# Patient Record
Sex: Female | Born: 1960 | Race: Black or African American | Marital: Married | Smoking: Never smoker
Health system: Southern US, Community
[De-identification: ages and names within clinical notes are randomized; demographics above are authoritative.]

## PROBLEM LIST (undated history)

## (undated) DIAGNOSIS — B9689 Other specified bacterial agents as the cause of diseases classified elsewhere: Secondary | ICD-10-CM

## (undated) DIAGNOSIS — B379 Candidiasis, unspecified: Secondary | ICD-10-CM

## (undated) DIAGNOSIS — Z8639 Personal history of other endocrine, nutritional and metabolic disease: Secondary | ICD-10-CM

## (undated) DIAGNOSIS — Z8742 Personal history of other diseases of the female genital tract: Secondary | ICD-10-CM

## (undated) DIAGNOSIS — R87629 Unspecified abnormal cytological findings in specimens from vagina: Secondary | ICD-10-CM

## (undated) DIAGNOSIS — N930 Postcoital and contact bleeding: Secondary | ICD-10-CM

## (undated) DIAGNOSIS — Z8619 Personal history of other infectious and parasitic diseases: Secondary | ICD-10-CM

## (undated) DIAGNOSIS — N760 Acute vaginitis: Secondary | ICD-10-CM

## (undated) HISTORY — DX: Personal history of other diseases of the female genital tract: Z87.42

## (undated) HISTORY — DX: Personal history of other endocrine, nutritional and metabolic disease: Z86.39

## (undated) HISTORY — PX: TONSILLECTOMY: SUR1361

## (undated) HISTORY — DX: Unspecified abnormal cytological findings in specimens from vagina: R87.629

## (undated) HISTORY — PX: DILATION AND CURETTAGE OF UTERUS: SHX78

## (undated) HISTORY — DX: Postcoital and contact bleeding: N93.0

## (undated) HISTORY — DX: Candidiasis, unspecified: B37.9

## (undated) HISTORY — DX: Personal history of other infectious and parasitic diseases: Z86.19

## (undated) HISTORY — DX: Other specified bacterial agents as the cause of diseases classified elsewhere: B96.89

## (undated) HISTORY — DX: Acute vaginitis: N76.0

## (undated) HISTORY — PX: WISDOM TOOTH EXTRACTION: SHX21

---

## 2011-06-15 ENCOUNTER — Encounter (INDEPENDENT_AMBULATORY_CARE_PROVIDER_SITE_OTHER): Payer: PRIVATE HEALTH INSURANCE | Admitting: Obstetrics and Gynecology

## 2011-06-15 DIAGNOSIS — N930 Postcoital and contact bleeding: Secondary | ICD-10-CM

## 2011-06-15 DIAGNOSIS — Z01419 Encounter for gynecological examination (general) (routine) without abnormal findings: Secondary | ICD-10-CM

## 2011-06-15 DIAGNOSIS — Z202 Contact with and (suspected) exposure to infections with a predominantly sexual mode of transmission: Secondary | ICD-10-CM

## 2011-06-15 DIAGNOSIS — B379 Candidiasis, unspecified: Secondary | ICD-10-CM

## 2011-06-15 DIAGNOSIS — Z8639 Personal history of other endocrine, nutritional and metabolic disease: Secondary | ICD-10-CM

## 2011-06-15 DIAGNOSIS — N760 Acute vaginitis: Secondary | ICD-10-CM

## 2011-06-15 DIAGNOSIS — Z3040 Encounter for surveillance of contraceptives, unspecified: Secondary | ICD-10-CM

## 2011-06-15 DIAGNOSIS — Z8742 Personal history of other diseases of the female genital tract: Secondary | ICD-10-CM

## 2011-06-15 DIAGNOSIS — B9689 Other specified bacterial agents as the cause of diseases classified elsewhere: Secondary | ICD-10-CM

## 2011-06-15 HISTORY — DX: Candidiasis, unspecified: B37.9

## 2011-06-15 HISTORY — DX: Personal history of other diseases of the female genital tract: Z87.42

## 2011-06-15 HISTORY — DX: Other specified bacterial agents as the cause of diseases classified elsewhere: B96.89

## 2011-06-15 HISTORY — DX: Postcoital and contact bleeding: N93.0

## 2011-06-15 HISTORY — DX: Personal history of other endocrine, nutritional and metabolic disease: Z86.39

## 2011-06-15 HISTORY — DX: Acute vaginitis: N76.0

## 2011-06-28 ENCOUNTER — Other Ambulatory Visit (INDEPENDENT_AMBULATORY_CARE_PROVIDER_SITE_OTHER): Payer: PRIVATE HEALTH INSURANCE

## 2011-06-28 DIAGNOSIS — Z3040 Encounter for surveillance of contraceptives, unspecified: Secondary | ICD-10-CM

## 2011-09-19 ENCOUNTER — Other Ambulatory Visit: Payer: PRIVATE HEALTH INSURANCE

## 2014-11-05 ENCOUNTER — Observation Stay (HOSPITAL_COMMUNITY): Payer: 59

## 2014-11-05 ENCOUNTER — Observation Stay (HOSPITAL_COMMUNITY)
Admission: EM | Admit: 2014-11-05 | Discharge: 2014-11-07 | Disposition: A | Discharge status: Home / Self Care | Payer: 59 | Attending: Internal Medicine | Admitting: Internal Medicine

## 2014-11-05 ENCOUNTER — Encounter (HOSPITAL_COMMUNITY): Payer: Self-pay

## 2014-11-05 DIAGNOSIS — D62 Acute posthemorrhagic anemia: Secondary | ICD-10-CM | POA: Diagnosis not present

## 2014-11-05 DIAGNOSIS — R0600 Dyspnea, unspecified: Secondary | ICD-10-CM | POA: Diagnosis not present

## 2014-11-05 DIAGNOSIS — N939 Abnormal uterine and vaginal bleeding, unspecified: Secondary | ICD-10-CM | POA: Insufficient documentation

## 2014-11-05 DIAGNOSIS — D649 Anemia, unspecified: Secondary | ICD-10-CM

## 2014-11-05 DIAGNOSIS — Z79899 Other long term (current) drug therapy: Secondary | ICD-10-CM | POA: Diagnosis not present

## 2014-11-05 DIAGNOSIS — N921 Excessive and frequent menstruation with irregular cycle: Secondary | ICD-10-CM | POA: Diagnosis not present

## 2014-11-05 DIAGNOSIS — R600 Localized edema: Secondary | ICD-10-CM | POA: Diagnosis not present

## 2014-11-05 DIAGNOSIS — R609 Edema, unspecified: Secondary | ICD-10-CM

## 2014-11-05 DIAGNOSIS — R531 Weakness: Secondary | ICD-10-CM | POA: Diagnosis present

## 2014-11-05 LAB — COMPREHENSIVE METABOLIC PANEL
ALBUMIN: 3.8 g/dL (ref 3.5–5.0)
ALK PHOS: 58 U/L (ref 38–126)
ALT: 13 U/L — ABNORMAL LOW (ref 14–54)
ANION GAP: 4 — AB (ref 5–15)
AST: 19 U/L (ref 15–41)
BILIRUBIN TOTAL: 0.6 mg/dL (ref 0.3–1.2)
BUN: 9 mg/dL (ref 6–20)
CALCIUM: 8 mg/dL — AB (ref 8.9–10.3)
CO2: 23 mmol/L (ref 22–32)
CREATININE: 0.79 mg/dL (ref 0.44–1.00)
Chloride: 110 mmol/L (ref 101–111)
GFR calc Af Amer: >60 mL/min (ref >60)
GFR calc non Af Amer: >60 mL/min (ref >60)
Glucose, Bld: 110 mg/dL — ABNORMAL HIGH (ref 65–99)
Potassium: 3.5 mmol/L (ref 3.5–5.1)
Sodium: 137 mmol/L (ref 135–145)
TOTAL PROTEIN: 7.4 g/dL (ref 6.5–8.1)

## 2014-11-05 LAB — CBC WITH DIFFERENTIAL/PLATELET
BASOS PCT: 1 % (ref 0–1)
Basophils Absolute: 0 10*3/uL (ref 0.0–0.1)
Eosinophils Absolute: 0.2 10*3/uL (ref 0.0–0.7)
Eosinophils Relative: 2 % (ref 0–5)
HCT: 21.9 % — ABNORMAL LOW (ref 36.0–46.0)
HEMOGLOBIN: 6.4 g/dL — AB (ref 12.0–15.0)
LYMPHS ABS: 2.3 10*3/uL (ref 0.7–4.0)
Lymphocytes Relative: 32 % (ref 12–46)
MCH: 23.7 pg — ABNORMAL LOW (ref 26.0–34.0)
MCHC: 29.2 g/dL — AB (ref 30.0–36.0)
MCV: 81.1 fL (ref 78.0–100.0)
Monocytes Absolute: 0.4 10*3/uL (ref 0.1–1.0)
Monocytes Relative: 6 % (ref 3–12)
NEUTROS ABS: 4.3 10*3/uL (ref 1.7–7.7)
Neutrophils Relative %: 59 % (ref 43–77)
PLATELETS: 544 10*3/uL — AB (ref 150–400)
RBC: 2.7 MIL/uL — AB (ref 3.87–5.11)
RDW: 16.7 % — AB (ref 11.5–15.5)
WBC: 7.3 10*3/uL (ref 4.0–10.5)

## 2014-11-05 LAB — ABO/RH: ABO/RH(D): O POS

## 2014-11-05 LAB — D-DIMER, QUANTITATIVE (NOT AT ARMC): D DIMER QUANT: 0.42 ug{FEU}/mL (ref 0.00–0.48)

## 2014-11-05 LAB — PREPARE RBC (CROSSMATCH)

## 2014-11-05 MED ORDER — MEGESTROL ACETATE 40 MG PO TABS
40.0000 mg | ORAL_TABLET | Freq: Every day | ORAL | Status: DC
  Administered 2014-11-05 – 2014-11-06 (×2): 40 mg via ORAL
  Filled 2014-11-05 (×2): qty 1

## 2014-11-05 MED ORDER — SODIUM CHLORIDE 0.9 % IV SOLN
10.0000 mL/h | Freq: Once | INTRAVENOUS | Status: AC
  Administered 2014-11-05: 10 mL/h via INTRAVENOUS

## 2014-11-05 NOTE — H&P (Addendum)
History and Physical  Shannon Collier ZOX:[id number] DOB: 1961-03-14 DOA: 11/05/2014  Referring physician: EDP PCP: Hal Morales, MD   Chief Complaint: weakness, anemia  HPI: Shannon Collier is a 54 y.o. female  With no significant past medical history is sent from PMD to ER due to weakness, anemia, DOE. Patient reported she has noticed spotting/irregular periods for the last few months. She reported her regular cycle stopped early this year in January, then she joined a weight loss program and has been getting HCG shots, she notice she has been having vaginal bleeding daily requiring two pad a day, she denies ab pain, no fever. She was seen by pmd yesterday due to progressive weakness, sob, dizziness, lab work showed she is significantly anemia, pmd called her today and advised her to come to the ED.  In the ED hgb 6.4, hospitalist called for admission.  Patient also reported recent trip to [facility], she noticed left leg edema when she drove back home. She reported some mild dry cough, no chest pain, does has DOE, feeling dizzy and weak.     Review of Systems:  Detail per HPI, Review of systems are otherwise negative  Past Medical History  Diagnosis Date  . H/O varicella   . Yeast infection 06/15/11  . H/O menorrhagia 06/15/11  . Post - coital bleeding 06/15/11  . BV (bacterial vaginosis) 06/15/11  . H/O goiter 06/15/11   Past Surgical History  Procedure Laterality Date  . Tonsillectomy    . Wisdom tooth extraction    . Dilation and curettage of uterus     Social History:  reports that she has never smoked. She has never used smokeless tobacco. She reports that she drinks alcohol. She reports that she does not use illicit drugs. Patient lives at home & is able to participate in activities of daily living independently   No Known Allergies  Family History  Problem Relation Age of Onset  . Diabetes Mother   . Arthritis Mother   . Thyroid disease Mother   . Diabetes Father        Prior to Admission medications   Medication Sig Start Date End Date Taking? Authorizing Provider  Aspirin-Acetaminophen-Caffeine (EXCEDRIN EXTRA STRENGTH PO) Take 2 tablets by mouth every 8 (eight) hours as needed (back pain).   Yes Historical Provider, MD  phentermine 37.5 MG capsule Take 37.5 mg by mouth every morning.   Yes Historical Provider, MD    Physical Exam: BP 138/69 mmHg  Pulse 90  Temp(Src) 98.2 F (36.8 C) (Oral)  Resp 20  SpO2 100%  LMP 11/05/2014  General:  Weak, look uncomfortable Eyes: PERRL ENT: unremarkable Neck: supple, no JVD Cardiovascular: RRR Respiratory: CTABL Abdomen: soft/ND/ND, positive bowel sounds Skin: no rash Musculoskeletal:  No edema Psychiatric: calm/cooperative Neurologic: no focal findings            Labs on Admission:  Basic Metabolic Panel:  Recent Labs Lab 11/05/14 1131  NA 137  K 3.5  CL 110  CO2 23  GLUCOSE 110*  BUN 9  CREATININE 0.79  CALCIUM 8.0*   Liver Function Tests:  Recent Labs Lab 11/05/14 1131  AST 19  ALT 13*  ALKPHOS 58  BILITOT 0.6  PROT 7.4  ALBUMIN 3.8   No results for input(s): LIPASE, AMYLASE in the last 168 hours. No results for input(s): AMMONIA in the last 168 hours. CBC:  Recent Labs Lab 11/05/14 1131  WBC 7.3  NEUTROABS 4.3  HGB 6.4*  HCT 21.9*  MCV 81.1  PLT 544*   Cardiac Enzymes: No results for input(s): CKTOTAL, CKMB, CKMBINDEX, TROPONINI in the last 168 hours.  BNP (last 3 results) No results for input(s): BNP in the last 8760 hours.  ProBNP (last 3 results) No results for input(s): PROBNP in the last 8760 hours.  CBG: No results for input(s): GLUCAP in the last 168 hours.  Radiological Exams on Admission: No results found.  EKG: Independently reviewed. pending  Assessment/Plan Present on Admission:  . Acute blood loss anemia   Acute blood loss anemia: patient is very symptomatic from it. likely from vaginal bleed, pelvic US pending, prbc transfusion.  Ivf. Gyn called by EDP. Gyn recommended start patient on megstrol and outpatient follow up with Dr. Erin Fulling and Monday endometrial biopsy.  Left lower extremity edema/DOE: recent long trip by car, lower extremity US and ddimer pending, will get CTA chest to r/o PE if ddimer elevated. May need IVC filter if she is tested positive for clot in the setting of blood loss anemia. Will also get echocardiogram due to dyspnea/ suspicious for PE, and h/o phentermine use.  Addendum: ddimer negative , will not order CTA at this time, awaiting lower extremity venous doppler study.  DVT prophylaxis: scd  Consultants: gyn called by EDP  Code Status: full   Family Communication:  Patient   Disposition Plan: admit to med tele  Time spent:  Andoni Busch MD, PhD [facility] Pager [phone] If 7PM-7AM, please contact night-coverage at [email or web address], password [facility]

## 2014-11-05 NOTE — ED Notes (Signed)
Doctor added on d-dimer.  Pt in ultrasound.  RN to notify us when she is back in her room.

## 2014-11-05 NOTE — ED Provider Notes (Signed)
CSN: [id number]     Arrival date & time 11/05/14  0920 History   First MD Initiated Contact with Patient 11/05/14 0935     Chief Complaint  Patient presents with  . Abnormal Lab     (Consider location/radiation/quality/duration/timing/severity/associated sxs/prior Treatment) Patient is a 54 y.o. female presenting with weakness. The history is provided by the patient. No language interpreter was used.  Weakness This is a new problem. The current episode started 1 to 4 weeks ago. The problem occurs constantly. The problem has been gradually worsening. Associated symptoms include weakness. Pertinent negatives include no abdominal pain. Nothing aggravates the symptoms. She has tried nothing for the symptoms.  Pt reports she did a mission camp a few weeks ago.  Pt reports progressive weakness.  She reports she has missed some periods but is now having daily spotting. Pt reports spotting started after hcg injection in March.  Pt thinks she is starting menopause.  Pt saw her MD yesterday, He called today and advised her that her hemoglobin was low and to come to the hospital for evaluation,  Pt reports fatigue and some trouble concentrating  Past Medical History  Diagnosis Date  . H/O varicella   . Yeast infection 06/15/11  . H/O menorrhagia 06/15/11  . Post - coital bleeding 06/15/11  . BV (bacterial vaginosis) 06/15/11  . H/O goiter 06/15/11   Past Surgical History  Procedure Laterality Date  . Tonsillectomy    . Wisdom tooth extraction    . Dilation and curettage of uterus     Family History  Problem Relation Age of Onset  . Diabetes Mother   . Arthritis Mother   . Thyroid disease Mother   . Diabetes Father    History  Substance Use Topics  . Smoking status: Never Smoker   . Smokeless tobacco: Never Used  . Alcohol Use: Yes     Comment: seldom   OB History    Gravida Para Term Preterm AB TAB SAB Ectopic Multiple Living   Review of Systems  Gastrointestinal:  Negative for abdominal pain.  Neurological: Positive for weakness.  All other systems reviewed and are negative.     Allergies  Review of patient's allergies indicates no known allergies.  Home Medications   Prior to Admission medications   Medication Sig Start Date End Date Taking? Authorizing Provider  Aspirin-Acetaminophen-Caffeine (EXCEDRIN EXTRA STRENGTH PO) Take 2 tablets by mouth every 8 (eight) hours as needed (back pain).   Yes Historical Provider, MD  phentermine 37.5 MG capsule Take 37.5 mg by mouth every morning.   Yes Historical Provider, MD   BP 138/69 mmHg  Pulse 90  Temp(Src) 98.2 F (36.8 C) (Oral)  Resp 20  SpO2 100%  LMP 11/05/2014 Physical Exam  Constitutional: She is oriented to person, place, and time. She appears well-developed and well-nourished.  HENT:  Head: Normocephalic.  Eyes: EOM are normal. Pupils are equal, round, and reactive to light.  Pale lids  Neck: Normal range of motion.  Cardiovascular: Normal rate, regular rhythm and normal heart sounds.   Pulmonary/Chest: Effort normal.  Abdominal: Soft. She exhibits no distension.  Musculoskeletal: Normal range of motion.  Neurological: She is alert and oriented to person, place, and time.  Skin: Skin is warm.  Psychiatric: She has a normal mood and affect.  Nursing note and vitals reviewed.   ED Course  Procedures (including critical care time) Labs Review Labs  Reviewed  CBC WITH DIFFERENTIAL/PLATELET - Abnormal; Notable for the following:    RBC 2.70 (*)    Hemoglobin 6.4 (*)    HCT 21.9 (*)    MCH 23.7 (*)    MCHC 29.2 (*)    RDW 16.7 (*)    Platelets 544 (*)    All other components within normal limits  COMPREHENSIVE METABOLIC PANEL - Abnormal; Notable for the following:    Glucose, Bld 110 (*)    Calcium 8.0 (*)    ALT 13 (*)    Anion gap 4 (*)    All other components within normal limits  TYPE AND SCREEN  PREPARE RBC (CROSSMATCH)  ABO/RH    Imaging Review No results  found.   EKG Interpretation None      MDM  Hemoglobin 6.4.   Pt type and cross ordered.   Call to hospitalist to see for admission   Final diagnoses:  Anemia, unspecified anemia type  Edema    I spoke to Dr. Erin Fulling who advised pt needs endometrial biopsy.  She will schedule for Monday Hospitalist advised    Elson Areas, PA-C 11/05/14 1353  Melene Plan, DO 11/05/14 1510

## 2014-11-05 NOTE — ED Notes (Addendum)
Patient states she was notified by her physician today that she had abnormal lab work. Patient states she thought they told her that she had a critical low iron level and a hgb level. Patient c/o feeling weak dizzy, and joint pain. Patient also states she has been having more frequent menstrual periods with heavy vaginal bleeding.

## 2014-11-05 NOTE — ED Notes (Signed)
Patient transported to Ultrasound 

## 2014-11-05 NOTE — ED Provider Notes (Signed)
Medical screening examination/treatment/procedure(s) were conducted as a shared visit with non-physician practitioner(s) and myself.  I personally evaluated the patient during the encounter.   EKG Interpretation None      54 yo F with a chief complaint of vaginal bleeding. This been going on for some time. Patient was seen at PCPs office found to have a low hemoglobin. Repeated here was 6.4. We'll transfuse the patient. Admit her to the hospital.  On my exam patient with pallor mild tachycardia, no noted murmurs abdomen soft nontender nondistended.  Melene Plan, DO 11/05/14 1316

## 2014-11-06 ENCOUNTER — Observation Stay (HOSPITAL_COMMUNITY): Payer: 59

## 2014-11-06 ENCOUNTER — Observation Stay (HOSPITAL_BASED_OUTPATIENT_CLINIC_OR_DEPARTMENT_OTHER): Payer: 59

## 2014-11-06 DIAGNOSIS — R609 Edema, unspecified: Secondary | ICD-10-CM

## 2014-11-06 DIAGNOSIS — D62 Acute posthemorrhagic anemia: Secondary | ICD-10-CM | POA: Diagnosis not present

## 2014-11-06 DIAGNOSIS — R0600 Dyspnea, unspecified: Secondary | ICD-10-CM | POA: Diagnosis not present

## 2014-11-06 DIAGNOSIS — N921 Excessive and frequent menstruation with irregular cycle: Secondary | ICD-10-CM | POA: Diagnosis not present

## 2014-11-06 LAB — TSH: TSH: 0.651 u[IU]/mL (ref 0.350–4.500)

## 2014-11-06 LAB — COMPREHENSIVE METABOLIC PANEL
ALT: 11 U/L — ABNORMAL LOW (ref 14–54)
AST: 18 U/L (ref 15–41)
Albumin: 3.2 g/dL — ABNORMAL LOW (ref 3.5–5.0)
Alkaline Phosphatase: 51 U/L (ref 38–126)
Anion gap: 4 — ABNORMAL LOW (ref 5–15)
BUN: 8 mg/dL (ref 6–20)
CO2: 22 mmol/L (ref 22–32)
CREATININE: 0.78 mg/dL (ref 0.44–1.00)
Calcium: 7.9 mg/dL — ABNORMAL LOW (ref 8.9–10.3)
Chloride: 109 mmol/L (ref 101–111)
GFR calc Af Amer: >60 mL/min (ref >60)
GLUCOSE: 109 mg/dL — AB (ref 65–99)
Potassium: 3.9 mmol/L (ref 3.5–5.1)
SODIUM: 135 mmol/L (ref 135–145)
Total Bilirubin: 0.7 mg/dL (ref 0.3–1.2)
Total Protein: 6.5 g/dL (ref 6.5–8.1)

## 2014-11-06 LAB — PREPARE RBC (CROSSMATCH)

## 2014-11-06 LAB — CBC
HEMATOCRIT: 26.7 % — AB (ref 36.0–46.0)
Hemoglobin: 8.1 g/dL — ABNORMAL LOW (ref 12.0–15.0)
MCH: 24.2 pg — AB (ref 26.0–34.0)
MCHC: 30.3 g/dL (ref 30.0–36.0)
MCV: 79.7 fL (ref 78.0–100.0)
Platelets: 452 10*3/uL — ABNORMAL HIGH (ref 150–400)
RBC: 3.35 MIL/uL — AB (ref 3.87–5.11)
RDW: 16.9 % — ABNORMAL HIGH (ref 11.5–15.5)
WBC: 7.7 10*3/uL (ref 4.0–10.5)

## 2014-11-06 LAB — PROTIME-INR
INR: 1.01 (ref 0.00–1.49)
PROTHROMBIN TIME: 13.5 s (ref 11.6–15.2)

## 2014-11-06 MED ORDER — SODIUM CHLORIDE 0.9 % IV SOLN: Freq: Once | INTRAVENOUS | Status: DC

## 2014-11-06 MED ORDER — MEGESTROL ACETATE 40 MG PO TABS
40.0000 mg | ORAL_TABLET | Freq: Two times a day (BID) | ORAL | Status: DC
  Administered 2014-11-06 – 2014-11-07 (×2): 40 mg via ORAL
  Filled 2014-11-06 (×3): qty 1

## 2014-11-06 MED ORDER — FERROUS SULFATE 325 (65 FE) MG PO TABS
325.0000 mg | ORAL_TABLET | Freq: Two times a day (BID) | ORAL | Status: DC
  Administered 2014-11-06 – 2014-11-07 (×2): 325 mg via ORAL
  Filled 2014-11-06 (×2): qty 1

## 2014-11-06 MED ORDER — MEGESTROL ACETATE 40 MG PO TABS: 40.0000 mg | ORAL_TABLET | Freq: Two times a day (BID) | ORAL | Status: DC

## 2014-11-06 MED ORDER — SENNOSIDES-DOCUSATE SODIUM 8.6-50 MG PO TABS: 1.0000 | ORAL_TABLET | Freq: Every day | ORAL | Status: AC

## 2014-11-06 NOTE — Progress Notes (Signed)
*  Preliminary Results* Bilateral lower extremity venous duplex completed. Bilateral lower extremities are negative for deep vein thrombosis. There is no evidence of Baker's cyst bilaterally.  11/06/2014  Gertie Fey, RVT, RDCS, RDMS

## 2014-11-06 NOTE — Progress Notes (Signed)
PROGRESS NOTE  Shannon Collier:[id number] DOB: Sep 11, 1960 DOA: 2014/11/20 PCP: Hal Morales, MD  HPI/Recap of past 24 hours:  Feeling litter better, still very weak  Assessment/Plan: Active Problems:   Acute blood loss anemia  Acute blood loss anemia: patient is very symptomatic from it.  prbc x2 on November 20, 2022, still weak in am, additional 1unit of prbc on 8/5. Repeat cbc in am. Will likely need to be  Discharged on iron supplement.  Vaginal bleed, pelvic US resulted noted, outpatient endometrial biopsy on  Monday. Dr. Erin Fulling contacted by EDP, recommended megace  po bid.  Left lower extremity edema/DOE: recent long trip by car, lower extremity US  Pending,  ddimer negative.  Dyspnea: likely from anemia, due to history of pherermine use, will check echocardiogram. No murmur heard on exam.    DVT prophylaxis: scd  Consultants: Dr. Erin Fulling, gyn called by EDP  Code Status: full   Family Communication: Patient    Disposition Plan: likely d/c tomorrow if medically stable   Consultants:  GYN Dr. Erin Fulling   Procedures:  prbc transfusionx3units  Antibiotics:  none   Objective: BP 119/54 mmHg  Pulse 64  Temp(Src) 98.2 F (36.8 C) (Oral)  Resp 18  Ht  (1.753 m)  Wt 90.538 kg (199 lb 9.6 oz)  BMI 29.46 kg/m2  SpO2 100%  LMP November 20, 2014  Intake/Output Summary (Last 24 hours) at 11/06/14 1207 Last data filed at 11/06/14 0800  Gross per 24 hour  Intake   1125 ml  Output      0 ml  Net   1125 ml   Filed Weights   2014/11/20 1456  Weight: 90.538 kg (199 lb 9.6 oz)    Exam:   General:  NAD  Cardiovascular: sinus tachycardia has improved.  Respiratory: CTABL  Abdomen: Soft/ND/NT, positive BS  Musculoskeletal: No Edema  Neuro: generalized weakness. No focal deficit.  Data Reviewed: Basic Metabolic Panel:  Recent Labs Lab 11/20/14 1131 11/06/14 0405  NA 137 135  K 3.5 3.9  CL 110 109  CO2 23 22  GLUCOSE 110*  109*  BUN 9 8  CREATININE 0.79 0.78  CALCIUM 8.0* 7.9*   Liver Function Tests:  Recent Labs Lab Nov 20, 2014 1131 11/06/14 0405  AST 19 18  ALT 13* 11*  ALKPHOS 58 51  BILITOT 0.6 0.7  PROT 7.4 6.5  ALBUMIN 3.8 3.2*   No results for input(s): LIPASE, AMYLASE in the last 168 hours. No results for input(s): AMMONIA in the last 168 hours. CBC:  Recent Labs Lab 11-20-14 1131 11/06/14 0405  WBC 7.3 7.7  NEUTROABS 4.3  --   HGB 6.4* 8.1*  HCT 21.9* 26.7*  MCV 81.1 79.7  PLT 544* 452*   Cardiac Enzymes:   No results for input(s): CKTOTAL, CKMB, CKMBINDEX, TROPONINI in the last 168 hours. BNP (last 3 results) No results for input(s): BNP in the last 8760 hours.  ProBNP (last 3 results) No results for input(s): PROBNP in the last 8760 hours.  CBG: No results for input(s): GLUCAP in the last 168 hours.  No results found for this or any previous visit (from the past 240 hour(s)).   Studies: US Transvaginal Non-ob  11-20-2014   CLINICAL DATA:  Continuous bleeding since March 2016. HCG use for weight loss.  EXAM: TRANSABDOMINAL AND TRANSVAGINAL ULTRASOUND OF PELVIS  TECHNIQUE: Both transabdominal and transvaginal ultrasound examinations of the pelvis were performed. Transabdominal technique was performed for global imaging of the pelvis including uterus, ovaries, adnexal regions,  and pelvic cul-de-sac. It was necessary to proceed with endovaginal exam following the transabdominal exam to visualize the ovaries and endometrium.  COMPARISON:  None  FINDINGS: Uterus  Measurements: 10 x 5 x 7 cm. At least 1 7 endometrial cyst noted.  Endometrium  Thickness: 14 mm. At the fundus, there is minimal cystic change or fluid accumulation. No discrete mass lesion is seen.  Right ovary  Measurements: 30 x 17 x 13 mm. A 15 mm hypoechoic mass within the right ovary has no internal color flow, but does have mid level echoes/septations.  Left ovary  Measurements: 32 x 10 x 13 mm. A rounded simple  appearing cyst in the left adnexa measures 22 mm and appears extra ovarian. There is a flattened cyst which is intra-ovarian, 25 x 7 x 17 mm  Other findings  No concerning pelvic fluid  IMPRESSION: 1. 13 mm endometrial stripe with mild heterogeneity. In the setting of abnormal bleeding and perimenopausal age, recommend gynecologic referral for consideration of endometrial sampling. 2. 15 mm complex cyst in the right ovary, appearance suggests a hemorrhagic cyst. Given age, followup in 6-12 weeks is recommended. 3. Simple appearing ovarian and paraovarian left-sided cysts measuring 25 and 22 mm respectively. 4. Subendometrial cystic change compatible with adenomyosis.   Electronically Signed   By: Marnee Spring M.D.   On: 11/05/2014 14:45   US Pelvis Complete  11/05/2014   CLINICAL DATA:  Continuous bleeding since March 2016. HCG use for weight loss.  EXAM: TRANSABDOMINAL AND TRANSVAGINAL ULTRASOUND OF PELVIS  TECHNIQUE: Both transabdominal and transvaginal ultrasound examinations of the pelvis were performed. Transabdominal technique was performed for global imaging of the pelvis including uterus, ovaries, adnexal regions, and pelvic cul-de-sac. It was necessary to proceed with endovaginal exam following the transabdominal exam to visualize the ovaries and endometrium.  COMPARISON:  None  FINDINGS: Uterus  Measurements: 10 x 5 x 7 cm. At least 1 7 endometrial cyst noted.  Endometrium  Thickness: 14 mm. At the fundus, there is minimal cystic change or fluid accumulation. No discrete mass lesion is seen.  Right ovary  Measurements: 30 x 17 x 13 mm. A 15 mm hypoechoic mass within the right ovary has no internal color flow, but does have mid level echoes/septations.  Left ovary  Measurements: 32 x 10 x 13 mm. A rounded simple appearing cyst in the left adnexa measures 22 mm and appears extra ovarian. There is a flattened cyst which is intra-ovarian, 25 x 7 x 17 mm  Other findings  No concerning pelvic fluid   IMPRESSION: 1. 13 mm endometrial stripe with mild heterogeneity. In the setting of abnormal bleeding and perimenopausal age, recommend gynecologic referral for consideration of endometrial sampling. 2. 15 mm complex cyst in the right ovary, appearance suggests a hemorrhagic cyst. Given age, followup in 6-12 weeks is recommended. 3. Simple appearing ovarian and paraovarian left-sided cysts measuring 25 and 22 mm respectively. 4. Subendometrial cystic change compatible with adenomyosis.   Electronically Signed   By: Marnee Spring M.D.   On: 11/05/2014 14:45    Scheduled Meds: . sodium chloride   Intravenous Once  . megestrol  40 mg Oral BID    Continuous Infusions:    Time spent:  Izyk Marty MD, PhD  [facility] Pager [phone]. If 7PM-7AM, please contact night-coverage at [email or web address], password [facility] 11/06/2014, 12:07 PM

## 2014-11-06 NOTE — Progress Notes (Signed)
*  PRELIMINARY RESULTS* Echocardiogram 2D Echocardiogram has been performed.  Shannon Collier 11/06/2014, 12:44 PM

## 2014-11-07 DIAGNOSIS — D62 Acute posthemorrhagic anemia: Secondary | ICD-10-CM | POA: Diagnosis not present

## 2014-11-07 DIAGNOSIS — N921 Excessive and frequent menstruation with irregular cycle: Secondary | ICD-10-CM | POA: Diagnosis not present

## 2014-11-07 LAB — BASIC METABOLIC PANEL
ANION GAP: 3 — AB (ref 5–15)
BUN: 9 mg/dL (ref 6–20)
CHLORIDE: 111 mmol/L (ref 101–111)
CO2: 22 mmol/L (ref 22–32)
Calcium: 8.1 mg/dL — ABNORMAL LOW (ref 8.9–10.3)
Creatinine, Ser: 0.69 mg/dL (ref 0.44–1.00)
GFR calc Af Amer: >60 mL/min (ref >60)
GFR calc non Af Amer: >60 mL/min (ref >60)
GLUCOSE: 105 mg/dL — AB (ref 65–99)
Potassium: 3.9 mmol/L (ref 3.5–5.1)
SODIUM: 136 mmol/L (ref 135–145)

## 2014-11-07 LAB — CBC
HCT: 30 % — ABNORMAL LOW (ref 36.0–46.0)
HEMOGLOBIN: 8.9 g/dL — AB (ref 12.0–15.0)
MCH: 23.3 pg — ABNORMAL LOW (ref 26.0–34.0)
MCHC: 29.7 g/dL — ABNORMAL LOW (ref 30.0–36.0)
MCV: 78.5 fL (ref 78.0–100.0)
Platelets: 419 10*3/uL — ABNORMAL HIGH (ref 150–400)
RBC: 3.82 MIL/uL — ABNORMAL LOW (ref 3.87–5.11)
RDW: 16.6 % — AB (ref 11.5–15.5)
WBC: 7.5 10*3/uL (ref 4.0–10.5)

## 2014-11-07 LAB — MAGNESIUM: Magnesium: 1.9 mg/dL (ref 1.7–2.4)

## 2014-11-07 MED ORDER — POLYETHYLENE GLYCOL 3350 17 G PO PACK: 17.0000 g | PACK | Freq: Every day | ORAL | Status: AC

## 2014-11-07 MED ORDER — FERROUS SULFATE 325 (65 FE) MG PO TABS: 325.0000 mg | ORAL_TABLET | Freq: Two times a day (BID) | ORAL | Status: AC

## 2014-11-07 NOTE — Progress Notes (Signed)
Pt discharge home by dr. Roda Shutters with follow up with gyn for endometrial biopsy next week. Pt is in agreement with plans. Pt alert and oriented, has no concerns at this time. Pt discharge home with family.

## 2014-11-07 NOTE — Discharge Summary (Signed)
Discharge Summary  Shannon Collier:[id number] DOB: 05/04/1960  PCP: Hal Morales, MD  Admit date: 11/05/2014 Discharge date: 11/07/2014  Time spent: <2mins  Recommendations for Outpatient Follow-up:  1. F/u with PMD in two week,s repeat cbc at follow up 2. F/u with gyn Dr. Erin Fulling for endometrial biopsy next week.  Discharge Diagnoses:  Active Hospital Problems   Diagnosis Date Noted  . Acute blood loss anemia 11/05/2014    Resolved Hospital Problems   Diagnosis Date Noted Date Resolved  No resolved problems to display.    Discharge Condition: stable  Diet recommendation: regular diet  Filed Weights   11/05/14 1456  Weight: 90.538 kg (199 lb 9.6 oz)    History of present illness:  Shannon Collier is a 54 y.o. female  With no significant past medical history is sent from PMD to ER due to weakness, anemia, DOE. Patient reported she has noticed spotting/irregular periods for the last few months. She reported her regular cycle stopped early this year in January, then she joined a weight loss program and has been getting HCG shots, she notice she has been having vaginal bleeding daily requiring two pad a day, she denies ab pain, no fever. She was seen by pmd yesterday due to progressive weakness, sob, dizziness, lab work showed she is significantly anemia, pmd called her today and advised her to come to the ED.  In the ED hgb 6.4, hospitalist called for admission.  Patient also reported recent trip to [facility], she noticed left leg edema when she drove back home. She reported some mild dry cough, no chest pain, does has DOE, feeling dizzy and weak.    Hospital Course:  Active Problems:   Acute blood loss anemia  Acute blood loss anemia: patient is very symptomatic from it. prbc x2 on 8/4, still weak in am, additional 1unit of prbc on 8/5.  Repeat cbc in am with appropriate increase of hgb. Discharge on iron supplement with stool softener. pmd to repeat cbc at  follow up to ensure stability.  Vaginal bleed,  pelvic US resulted noted, outpatient endometrial biopsy next week by Dr. Erin Fulling contacted by EDP,  Started megace  po bid.  Left lower extremity edema/DOE: recent long trip by car, lower extremity US negative for  DVT, ddimer negative. No edema at discharge.  Dyspnea: likely from anemia, due to history of pherermine use, echocardiogram, no valvular abnormality, LVEF wnl, mild diastolic dysfunction, euvolemic at discharge    Consultants: Dr. Erin Fulling, gyn called by EDP  Code Status: full   Family Communication: Patient    Disposition Plan:  d/c home   Consultants:  GYN Dr. Erin Fulling  Procedures:  prbc transfusionx3units  Antibiotics:  none  Discharge Exam: BP 115/66 mmHg  Pulse 73  Temp(Src) 98.3 F (36.8 C) (Oral)  Resp 16  Ht  (1.753 m)  Wt 90.538 kg (199 lb 9.6 oz)  BMI 29.46 kg/m2  SpO2 99%  LMP 11/05/2014   General: NAD  Cardiovascular: sinus tachycardia resolved  Respiratory: CTABL  Abdomen: Soft/ND/NT, positive BS  Musculoskeletal: No Edema  Neuro:  No focal deficit.   Discharge Instructions You were cared for by a hospitalist during your hospital stay. If you have any questions about your discharge medications or the care you received while you were in the hospital after you are discharged, you can call the unit and asked to speak with the hospitalist on call if the hospitalist that took care of you is not available. Once you  are discharged, your primary care physician will handle any further medical issues. Please note that NO REFILLS for any discharge medications will be authorized once you are discharged, as it is imperative that you return to your primary care physician (or establish a relationship with a primary care physician if you do not have one) for your aftercare needs so that they can reassess your need for medications and monitor your lab values.        Discharge Instructions    Diet - low sodium heart healthy    Complete by:  As directed      Increase activity slowly    Complete by:  As directed             Medication List    STOP taking these medications        EXCEDRIN EXTRA STRENGTH PO     phentermine 37.5 MG capsule      TAKE these medications        ferrous sulfate 325 (65 FE) MG tablet  Take 1 tablet (325 mg total) by mouth 2 (two) times daily with a meal.     megestrol 40 MG tablet  Commonly known as:  MEGACE  Take 1 tablet (40 mg total) by mouth 2 (two) times daily.     polyethylene glycol packet  Commonly known as:  MIRALAX  Take 17 g by mouth daily.     senna-docusate 8.6-50 MG per tablet  Commonly known as:  Senokot-S  Take 1 tablet by mouth at bedtime.       No Known Allergies Follow-up Information    Follow up with Willodean Rosenthal, MD On 11/09/2014.   Specialty:  Obstetrics and Gynecology   Why:  12noon , endometrial biopsy /vaginal bleed   Contact information:   [address] [place] [address] [phone]       Follow up with Hal Morales, MD In 2 weeks.   Specialty:  Obstetrics and Gynecology   Why:  hospital discharge follow up, repeat cbc at follow up   Contact information:   [address] [place] [address] [phone]        The results of significant diagnostics from this hospitalization (including imaging, microbiology, ancillary and laboratory) are listed below for reference.    Significant Diagnostic Studies: US Transvaginal Non-ob  November 27, 2014   CLINICAL DATA:  Continuous bleeding since March 2016. HCG use for weight loss.  EXAM: TRANSABDOMINAL AND TRANSVAGINAL ULTRASOUND OF PELVIS  TECHNIQUE: Both transabdominal and transvaginal ultrasound examinations of the pelvis were performed. Transabdominal technique was performed for global imaging of the pelvis including uterus, ovaries, adnexal regions, and pelvic cul-de-sac. It was necessary  to proceed with endovaginal exam following the transabdominal exam to visualize the ovaries and endometrium.  COMPARISON:  None  FINDINGS: Uterus  Measurements: 10 x 5 x 7 cm. At least 1 7 endometrial cyst noted.  Endometrium  Thickness: 14 mm. At the fundus, there is minimal cystic change or fluid accumulation. No discrete mass lesion is seen.  Right ovary  Measurements: 30 x 17 x 13 mm. A 15 mm hypoechoic mass within the right ovary has no internal color flow, but does have mid level echoes/septations.  Left ovary  Measurements: 32 x 10 x 13 mm. A rounded simple appearing cyst in the left adnexa measures 22 mm and appears extra ovarian. There is a flattened cyst which is intra-ovarian, 25 x 7 x 17 mm  Other findings  No concerning pelvic  fluid  IMPRESSION: 1. 13 mm endometrial stripe with mild heterogeneity. In the setting of abnormal bleeding and perimenopausal age, recommend gynecologic referral for consideration of endometrial sampling. 2. 15 mm complex cyst in the right ovary, appearance suggests a hemorrhagic cyst. Given age, followup in 6-12 weeks is recommended. 3. Simple appearing ovarian and paraovarian left-sided cysts measuring 25 and 22 mm respectively. 4. Subendometrial cystic change compatible with adenomyosis.   Electronically Signed   By: Marnee Spring M.D.   On: 11/05/2014 14:45   US Pelvis Complete  11/05/2014   CLINICAL DATA:  Continuous bleeding since March 2016. HCG use for weight loss.  EXAM: TRANSABDOMINAL AND TRANSVAGINAL ULTRASOUND OF PELVIS  TECHNIQUE: Both transabdominal and transvaginal ultrasound examinations of the pelvis were performed. Transabdominal technique was performed for global imaging of the pelvis including uterus, ovaries, adnexal regions, and pelvic cul-de-sac. It was necessary to proceed with endovaginal exam following the transabdominal exam to visualize the ovaries and endometrium.  COMPARISON:  None  FINDINGS: Uterus  Measurements: 10 x 5 x 7 cm. At least 1 7  endometrial cyst noted.  Endometrium  Thickness: 14 mm. At the fundus, there is minimal cystic change or fluid accumulation. No discrete mass lesion is seen.  Right ovary  Measurements: 30 x 17 x 13 mm. A 15 mm hypoechoic mass within the right ovary has no internal color flow, but does have mid level echoes/septations.  Left ovary  Measurements: 32 x 10 x 13 mm. A rounded simple appearing cyst in the left adnexa measures 22 mm and appears extra ovarian. There is a flattened cyst which is intra-ovarian, 25 x 7 x 17 mm  Other findings  No concerning pelvic fluid  IMPRESSION: 1. 13 mm endometrial stripe with mild heterogeneity. In the setting of abnormal bleeding and perimenopausal age, recommend gynecologic referral for consideration of endometrial sampling. 2. 15 mm complex cyst in the right ovary, appearance suggests a hemorrhagic cyst. Given age, followup in 6-12 weeks is recommended. 3. Simple appearing ovarian and paraovarian left-sided cysts measuring 25 and 22 mm respectively. 4. Subendometrial cystic change compatible with adenomyosis.   Electronically Signed   By: Marnee Spring M.D.   On: 11/05/2014 14:45    Microbiology: No results found for this or any previous visit (from the past 240 hour(s)).   Labs: Basic Metabolic Panel:  Recent Labs Lab 11/05/14 1131 11/06/14 0405 11/07/14 0423  NA 137 135 136  K 3.5 3.9 3.9  CL 110 109 111  CO2 23 22 22   GLUCOSE 110* 109* 105*  BUN 9 8 9   CREATININE 0.79 0.78 0.69  CALCIUM 8.0* 7.9* 8.1*  MG  --   --  1.9   Liver Function Tests:  Recent Labs Lab 11/05/14 1131 11/06/14 0405  AST 19 18  ALT 13* 11*  ALKPHOS 58 51  BILITOT 0.6 0.7  PROT 7.4 6.5  ALBUMIN 3.8 3.2*   No results for input(s): LIPASE, AMYLASE in the last 168 hours. No results for input(s): AMMONIA in the last 168 hours. CBC:  Recent Labs Lab 11/05/14 1131 11/06/14 0405 11/07/14 0423  WBC 7.3 7.7 7.5  NEUTROABS 4.3  --   --   HGB 6.4* 8.1* 8.9*  HCT 21.9*  26.7* 30.0*  MCV 81.1 79.7 78.5  PLT 544* 452* 419*   Cardiac Enzymes: No results for input(s): CKTOTAL, CKMB, CKMBINDEX, TROPONINI in the last 168 hours. BNP: BNP (last 3 results) No results for input(s): BNP in the last 8760 hours.  ProBNP (last 3 results) No results for input(s): PROBNP in the last 8760 hours.  CBG: No results for input(s): GLUCAP in the last 168 hours.     SignedAlbertine Grates MD, PhD  [facility] 11/07/2014, 9:50 AM

## 2014-11-08 LAB — TYPE AND SCREEN
ABO/RH(D): O POS
ANTIBODY SCREEN: NEGATIVE
UNIT DIVISION: 0
Unit division: 0
Unit division: 0

## 2014-11-12 ENCOUNTER — Other Ambulatory Visit (HOSPITAL_COMMUNITY)
Admission: RE | Admit: 2014-11-12 | Discharge: 2014-11-12 | Disposition: A | Discharge status: Home / Self Care | Payer: 59 | Source: Ambulatory Visit | Attending: Family Medicine | Admitting: Family Medicine

## 2014-11-12 ENCOUNTER — Encounter: Payer: Self-pay | Admitting: Family Medicine

## 2014-11-12 ENCOUNTER — Ambulatory Visit (INDEPENDENT_AMBULATORY_CARE_PROVIDER_SITE_OTHER): Payer: 59 | Admitting: Family Medicine

## 2014-11-12 VITALS — BP 145/76 | HR 88 | Temp 98.2°F | Ht 69.0 in | Wt 201.0 lb

## 2014-11-12 DIAGNOSIS — Z01419 Encounter for gynecological examination (general) (routine) without abnormal findings: Secondary | ICD-10-CM

## 2014-11-12 DIAGNOSIS — Z124 Encounter for screening for malignant neoplasm of cervix: Secondary | ICD-10-CM | POA: Diagnosis not present

## 2014-11-12 DIAGNOSIS — N841 Polyp of cervix uteri: Secondary | ICD-10-CM | POA: Insufficient documentation

## 2014-11-12 DIAGNOSIS — N8320 Unspecified ovarian cysts: Secondary | ICD-10-CM

## 2014-11-12 DIAGNOSIS — N938 Other specified abnormal uterine and vaginal bleeding: Secondary | ICD-10-CM | POA: Diagnosis not present

## 2014-11-12 DIAGNOSIS — Z1151 Encounter for screening for human papillomavirus (HPV): Secondary | ICD-10-CM

## 2014-11-12 DIAGNOSIS — N83201 Unspecified ovarian cyst, right side: Secondary | ICD-10-CM

## 2014-11-12 LAB — POCT URINALYSIS DIP (DEVICE)
Bilirubin Urine: NEGATIVE
GLUCOSE, UA: NEGATIVE mg/dL
Ketones, ur: NEGATIVE mg/dL
Nitrite: NEGATIVE
Protein, ur: NEGATIVE mg/dL
Specific Gravity, Urine: 1.02 (ref 1.005–1.030)
Urobilinogen, UA: 0.2 mg/dL (ref 0.0–1.0)
pH: 5.5 (ref 5.0–8.0)

## 2014-11-12 MED ORDER — MEGESTROL ACETATE 40 MG PO TABS: 40.0000 mg | ORAL_TABLET | Freq: Two times a day (BID) | ORAL | Status: DC

## 2014-11-12 NOTE — Progress Notes (Signed)
Subjective:    Patient ID: Shannon Collier, female    DOB: Feb 26, 1961, 54 y.o.   MRN: [id number]  HPI Patient seen for DUB.  Has had intermittent bleeding with no clear pattern since April, but had 40 days of bleeding last month.  Was seen in ED and admitted for anemia.  Had blood transfusion and started on megace.  Bleeding improved on megace.  No provoking factors for her bleeding.  The patient feels improved after the blood transfusion.  She has never had irregular bleeding like this in the past.   Review of Systems  Constitutional: Negative for fever, chills, activity change, appetite change and fatigue.  Gastrointestinal: Negative for nausea, vomiting, abdominal pain and diarrhea.  Genitourinary: Negative for dysuria, vaginal discharge, vaginal pain and pelvic pain.  All other systems reviewed and are negative.   I have reviewed the patients past medical, family, and social history.  I have reviewed the patient's medication list and allergies.      Objective:   Physical Exam  Constitutional: She appears well-developed and well-nourished.  HENT:  Head: Normocephalic and atraumatic.  Right Ear: External ear normal.  Left Ear: External ear normal.  Eyes: Pupils are equal, round, and reactive to light.  Neck: Normal range of motion. Neck supple. Thyromegaly (slight 1cm nodule on right (known by Primary doctor)) present.  Cardiovascular: Normal rate and regular rhythm.  Exam reveals no gallop and no friction rub.   No murmur heard. Pulmonary/Chest: Effort normal and breath sounds normal. No respiratory distress. She has no wheezes. She has no rales.  Abdominal: Soft. Bowel sounds are normal. She exhibits no distension. There is no tenderness. There is no rebound and no guarding. Hernia confirmed negative in the right inguinal area and confirmed negative in the left inguinal area.  Genitourinary: There is no rash, tenderness, lesion or injury on the right labia. There is no rash,  tenderness, lesion or injury on the left labia. Uterus is not deviated, not enlarged, not fixed and not tender. Cervix exhibits discharge. Cervix exhibits no motion tenderness and no friability. Right adnexum displays fullness. Right adnexum displays no mass and no tenderness. Left adnexum displays fullness. Left adnexum displays no mass and no tenderness. No erythema, tenderness or bleeding in the vagina. No foreign body around the vagina. No signs of injury around the vagina. No vaginal discharge found.    Lymphadenopathy:       Right: No inguinal adenopathy present.       Left: No inguinal adenopathy present.  Skin: Skin is warm and dry.  Psychiatric: She has a normal mood and affect. Her behavior is normal. Judgment and thought content normal.          Assessment & Plan:  1. DUB (dysfunctional uterine bleeding) - likely adenomyosis Endometrial biopsy done.  See procedure note below.  Discussed medical treatment options - Megace, Depo provera, IUD. - Surgical pathology  2. Cervical polyp Removed - see procedure note - Surgical pathology  3. Cyst of right ovary Needs [place] in 6 weeks. - US Pelvis Complete; Future   ENDOMETRIAL BIOPSY     The indications for endometrial biopsy were reviewed.   Risks of the biopsy including cramping, bleeding, infection, uterine perforation, inadequate specimen and need for additional procedures  were discussed. The patient states she understands and agrees to undergo procedure today. Consent was signed. Time out was performed. Urine HCG was negative. A sterile speculum was placed in the patient's vagina and the cervix was  prepped with Betadine. A cervical polyp was noted.  The polyp was grasped with ring forceps and twisted on the stalk until it fell off.  The 3 mm pipelle was introduced into the endometrial cavity without difficulty to a depth of 9 cm, and a moderate amount of tissue was obtained and sent to pathology. The instruments were removed from  the patient's vagina. Minimal bleeding from the cervix was noted. The patient tolerated the procedure well. Routine post-procedure instructions were given to the patient. The patient will follow up to review the results and for further management.

## 2014-11-12 NOTE — Addendum Note (Signed)
Addended by: Kathee Delton on: 11/12/2014 03:40 PM   Modules accepted: Orders

## 2014-11-16 LAB — CYTOLOGY - PAP

## 2014-11-19 ENCOUNTER — Telehealth: Payer: Self-pay

## 2014-11-19 NOTE — Telephone Encounter (Signed)
Per Dr. Adrian Blackwater, her endometrial biopsy is normal and he recommends IUD, but could also do depo provera vs continuing megace.  Called pt and LM to return call to the clinics.

## 2014-11-20 NOTE — Telephone Encounter (Signed)
Pt returned call and I informed her that her endo bx results are normal; also informed her that the provider recommended IUD, depo provera, or megace.  Pt was excited for her results and stated that she wants to continue with Megace that was prescribed already.  I advised pt to take medication for the two months she has prescribed if she wants to continue with Megace then to call the office to speak with a nurse and we contact Dr. Adrian Blackwater concerning refill or if she wants to change then to call and make an appointment.  Pt agreed.   Pt asked about her pap smear results.  I informed her that it showed atypical glandular cells which was further assessed with endo bx that was completed.  Dr. Adrian Blackwater states that the pt will need a colposcopy.  Was given permission from the pt to leave a detailed message on VM if she does not pick up.  LM on VM stating the need for the colposcopy and to call the office to schedule appt.

## 2014-11-20 NOTE — Progress Notes (Signed)
Pt returned call and asked what a colposcopy is?   I explained to her about the colposcopy.  Pt stated understanding and had no further questions.   Appt scheduled for September 14 @ 1445.  Pt agreed.

## 2014-12-02 ENCOUNTER — Telehealth: Payer: Self-pay | Admitting: *Deleted

## 2014-12-02 NOTE — Telephone Encounter (Signed)
Pt left message stating that she had recent EMB, was placed on Megestrol and has appt on 9/14 for Colpo. She has started bleeding and thinks it may be her cycle but it is a "steady flow".  She wants to know if this is normal even with taking the Megestrol. Please call back.

## 2014-12-04 NOTE — Telephone Encounter (Signed)
Spoke with Dr. Adrian Blackwater.  Orders received for patient to start Megace 80 mg twice daily until her next appointment on 12/16/14 with Dr. Adrian Blackwater.  Phoned patient and explained recommendation.  Patient states understanding. Told patient if she doesn't have enough medication with the dosage change to give [place] a call and we can get her a refill.  Patient states understanding.

## 2014-12-04 NOTE — Telephone Encounter (Signed)
Received message left on nurse line on 12/04/14 at 0826.  Patient states this is her 3rd call this week.  States she was seen by Dr. Adrian Blackwater for heavy vag bleeding.  She is currently having her cycle.  This is her 8th day of heavy bleeding.  She wants to know if she needs to be seen.  Requests a return call.  Patient's hemoglobin 11/06/14 = 7.7.  Received transfusion on 11/10/14.  Was seen by Dr. Adrian Blackwater on 11/12/14.  I have sent a message to Dr. Adrian Blackwater to see if he can give me a call to discuss then will contact patient.

## 2014-12-09 ENCOUNTER — Telehealth: Payer: Self-pay

## 2014-12-09 NOTE — Telephone Encounter (Signed)
Pt called and stated that she is taking Megace twice day and she is getting ready to run out.  She is requesting a refill.

## 2014-12-09 NOTE — Telephone Encounter (Signed)
Called patient, no answer- left message stating we are trying to reach you to return your phone call, please call us back at the clinics 

## 2014-12-10 MED ORDER — MEGESTROL ACETATE 40 MG PO TABS: 80.0000 mg | ORAL_TABLET | Freq: Two times a day (BID) | ORAL | Status: DC

## 2014-12-10 NOTE — Telephone Encounter (Signed)
Pt stopped by clinic. She needed new rx sent to pharmacy with instructions. Pt is taking  bid of Megace.

## 2014-12-16 ENCOUNTER — Other Ambulatory Visit (HOSPITAL_COMMUNITY)
Admission: RE | Admit: 2014-12-16 | Discharge: 2014-12-16 | Disposition: A | Discharge status: Home / Self Care | Payer: 59 | Source: Ambulatory Visit | Attending: Family Medicine | Admitting: Family Medicine

## 2014-12-16 ENCOUNTER — Ambulatory Visit (INDEPENDENT_AMBULATORY_CARE_PROVIDER_SITE_OTHER): Payer: 59 | Admitting: Family Medicine

## 2014-12-16 ENCOUNTER — Encounter: Payer: Self-pay | Admitting: Family Medicine

## 2014-12-16 VITALS — BP 150/87 | HR 86 | Temp 98.7°F | Ht 69.0 in | Wt 199.3 lb

## 2014-12-16 DIAGNOSIS — R87619 Unspecified abnormal cytological findings in specimens from cervix uteri: Secondary | ICD-10-CM

## 2014-12-16 DIAGNOSIS — Z3202 Encounter for pregnancy test, result negative: Secondary | ICD-10-CM

## 2014-12-16 LAB — POCT PREGNANCY, URINE: Preg Test, Ur: NEGATIVE

## 2014-12-16 NOTE — Patient Instructions (Signed)
COLPOSCOPY POST-PROCEDURE INSTRUCTIONS  1. You may take Ibuprofen, Aleve or Tylenol for cramping if needed.  2. If Monsel's solution was used, you will have a black discharge.  3. Light bleeding is normal.  If bleeding is heavier than your period, please call.  4. Put nothing in your vagina until the bleeding or discharge stops (usually 2 or3 days).  5. We will call you within one week with biopsy results or discuss the results at your follow-up appointment if needed. 6.  

## 2014-12-16 NOTE — Progress Notes (Signed)
AGC on PAP.  Previously had endometrial biopsy.  Patient given informed consent, signed copy in the chart, time out was performed.  Placed in lithotomy position. Cervix viewed with speculum and colposcope after application of acetic acid.   Colposcopy adequate?  yes Acetowhite lesions?no Punctation?no Mosaicism?  no Abnormal vasculature?  no Biopsies?no ECC?yes  Patient was given post procedure instructions.  She will return in 2 weeks for results.

## 2014-12-25 ENCOUNTER — Telehealth: Payer: Self-pay | Admitting: *Deleted

## 2014-12-25 NOTE — Telephone Encounter (Signed)
Contacted patient, colposcopy results given with recommendations for next pap smear with co testing in 12 months.  Pt desires IUD for bleeding. Pt states she has insurance.

## 2015-01-15 ENCOUNTER — Ambulatory Visit (INDEPENDENT_AMBULATORY_CARE_PROVIDER_SITE_OTHER): Payer: 59 | Admitting: Family Medicine

## 2015-01-15 ENCOUNTER — Encounter: Payer: Self-pay | Admitting: Family Medicine

## 2015-01-15 VITALS — BP 138/97 | HR 87 | Temp 98.5°F | Ht 69.0 in | Wt 193.4 lb

## 2015-01-15 DIAGNOSIS — Z3202 Encounter for pregnancy test, result negative: Secondary | ICD-10-CM

## 2015-01-15 DIAGNOSIS — N938 Other specified abnormal uterine and vaginal bleeding: Secondary | ICD-10-CM

## 2015-01-15 LAB — POCT PREGNANCY, URINE: PREG TEST UR: NEGATIVE

## 2015-01-15 MED ORDER — LEVONORGESTREL 18.6 MCG/DAY IU IUD
INTRAUTERINE_SYSTEM | Freq: Once | INTRAUTERINE | Status: AC
  Administered 2015-01-15: 1 via INTRAUTERINE

## 2015-01-15 NOTE — Progress Notes (Signed)
IUD placed due to vaginal bleeding  IUD Procedure Note Patient identified, informed consent performed, signed copy in chart, time out was performed.  Urine pregnancy test negative.  Speculum placed in the vagina.  Cervix visualized.  Cleaned with Betadine x 2.  Grasped anteriorly with a single tooth tenaculum.  Uterus sounded to 7 cm.  Mirena IUD placed per manufacturer's recommendations.  Strings trimmed to 3 cm. Tenaculum was removed, good hemostasis noted.  Patient tolerated procedure well.   Patient given post procedure instructions and Mirena care card with expiration date.  Patient is asked to check IUD strings periodically and follow up in 4-6 weeks for IUD check.

## 2015-01-15 NOTE — Patient Instructions (Signed)

## 2015-01-15 NOTE — Addendum Note (Signed)
Addended by: Faythe CasaBELLAMY, Blossom Crume M on: 01/15/2015 09:17 AM   Modules accepted: Orders

## 2015-01-29 ENCOUNTER — Encounter: Payer: Self-pay | Admitting: *Deleted

## 2015-02-02 ENCOUNTER — Other Ambulatory Visit: Payer: Self-pay | Admitting: Radiology

## 2015-02-08 ENCOUNTER — Other Ambulatory Visit: Payer: Self-pay | Admitting: General Practice

## 2015-02-08 DIAGNOSIS — N938 Other specified abnormal uterine and vaginal bleeding: Secondary | ICD-10-CM

## 2015-02-08 MED ORDER — MEGESTROL ACETATE 40 MG PO TABS
40.0000 mg | ORAL_TABLET | Freq: Two times a day (BID) | ORAL | Status: AC
Start: 2015-02-08 — End: ?

## 2015-02-08 NOTE — Progress Notes (Unsigned)
Received fax refill request on patient's megace. Per Dr Macon LargeAnyanwu, may refill patient's megace.

## 2015-02-09 ENCOUNTER — Encounter: Payer: Self-pay | Admitting: *Deleted

## 2015-02-11 ENCOUNTER — Encounter: Payer: Self-pay | Admitting: Family Medicine

## 2015-02-11 ENCOUNTER — Ambulatory Visit (INDEPENDENT_AMBULATORY_CARE_PROVIDER_SITE_OTHER): Payer: 59 | Admitting: Medical

## 2015-02-11 VITALS — BP 152/81 | HR 82 | Temp 98.6°F | Ht 69.0 in | Wt 197.3 lb

## 2015-02-11 DIAGNOSIS — Z30431 Encounter for routine checking of intrauterine contraceptive device: Secondary | ICD-10-CM | POA: Diagnosis not present

## 2015-02-11 NOTE — Progress Notes (Signed)
Subjective:     Patient ID: Dolores LoryMia R Newby, female   DOB: [birth year], 54 y.o.   MRN: [id number]  HPI Mrs. Ace GinsBuck is a 54yo female presenting today for string check following recent IUD placement. - IUD placed 01/15/15 - Notes significant improvement of bleeding following IUD placement. Now spots occasionally. - Stopped Megace as instructed - Feels much better following IUD placement and improvement in bleeding - Believes she had her menstrual cycle last week. Bleeding was slightly heavier, but not as heavy as prior to IUD placement.  Review of Systems Per HPI    Objective:   Physical Exam  Constitutional: She appears well-developed and well-nourished. No distress.  Genitourinary:  Cervix visualized with slight amount of blood. No strings visualized.  Psychiatric: She has a normal mood and affect. Her behavior is normal.       Assessment and Plan:     IUD check up - Placed 01/15/15. Strings not visualized. - US to check for IUD placement - Follow up pending US results.

## 2015-02-11 NOTE — Assessment & Plan Note (Signed)
-   Placed 01/15/15. Strings not visualized. - US to check for IUD placement - Follow up pending US results.

## 2015-02-11 NOTE — Patient Instructions (Signed)

## 2015-02-12 ENCOUNTER — Ambulatory Visit (HOSPITAL_COMMUNITY)
Admission: RE | Admit: 2015-02-12 | Discharge: 2015-02-12 | Disposition: A | Discharge status: Home / Self Care | Payer: 59 | Source: Ambulatory Visit | Attending: Medical | Admitting: Medical

## 2015-02-12 DIAGNOSIS — Z30431 Encounter for routine checking of intrauterine contraceptive device: Secondary | ICD-10-CM | POA: Insufficient documentation

## 2015-02-17 ENCOUNTER — Telehealth: Payer: Self-pay | Admitting: General Practice

## 2015-02-17 NOTE — Telephone Encounter (Signed)
Per Shannon FanningJulie, US shows IUD is in the right position. She will need to inform any future provider that her strings are not visible, but that we have confirmed her IUD is in position.  Called patient, no answer- left message stating we are trying to reach you with results, please call us back at the clinics

## 2015-02-18 NOTE — Telephone Encounter (Signed)
Called [facility] and left  A message we are calling with some non- urgent message. If you can call back today by 12 ask for Shannon Collier, If it is after 12 please leave a message with best number and time to reach you and if we may leave detailed information on your voicemail.

## 2015-02-18 NOTE — Telephone Encounter (Signed)
You are correct. US does not show any abnormalities. Please inform the patient that cysts often come and go depending on hormonal changes and only in rare cases do they persist for long periods of time and require any kind of intervention.   Thanks!  Raynelle FanningJulie

## 2015-02-18 NOTE — Telephone Encounter (Signed)
Elexis called back. Informed her per Raynelle FanningJulie ultrasound shows IUD is in correct place  And she should notify any future providers that strings not visible, but ultrasound confirmed placement. She also asked if there were any cysts on her ovary- said one of the doctors had mentioned it. Per ultrasound results I notified her results showed normal right and left ovaries without adnexal masses.  I did not see a note about her having a cyst from her last 2 visits.  She voices understanding.

## 2015-03-09 NOTE — Telephone Encounter (Signed)
Called [facility] and left a message I am calling back to folllow up from our last conversation- please call clinic and [facility]

## 2015-03-09 NOTE — Telephone Encounter (Signed)
Shannon Collier called back and left a message she was returning my call.   Called Shannon Collier and gave her the information per Shannon FanningJulie. She voiced understanding and no other questions or concerns voiced.

## 2015-08-12 ENCOUNTER — Encounter: Payer: Self-pay | Admitting: *Deleted

## 2015-11-12 IMAGING — US US TRANSVAGINAL NON-OB
1 series · 13 of 25 positions shown · non-contrast
Comparison: None

CLINICAL DATA: Continuous bleeding since June 2014. HCG use for
weight loss.

EXAM:
TRANSABDOMINAL AND TRANSVAGINAL ULTRASOUND OF PELVIS
TECHNIQUE: Both transabdominal and transvaginal ultrasound examinations of the
pelvis were performed. Transabdominal technique was performed for
global imaging of the pelvis including uterus, ovaries, adnexal
regions, and pelvic cul-de-sac. It was necessary to proceed with
endovaginal exam following the transabdominal exam to visualize the
ovaries and endometrium.

[Series 1: us transvaginal non-ob · 0.24mm/px · 13 of 92 slices shown]
[im 1/92]
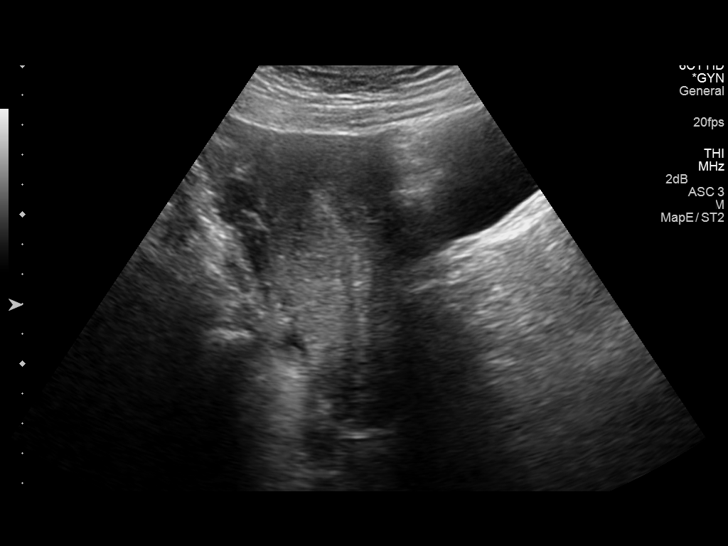
[im 8/92]
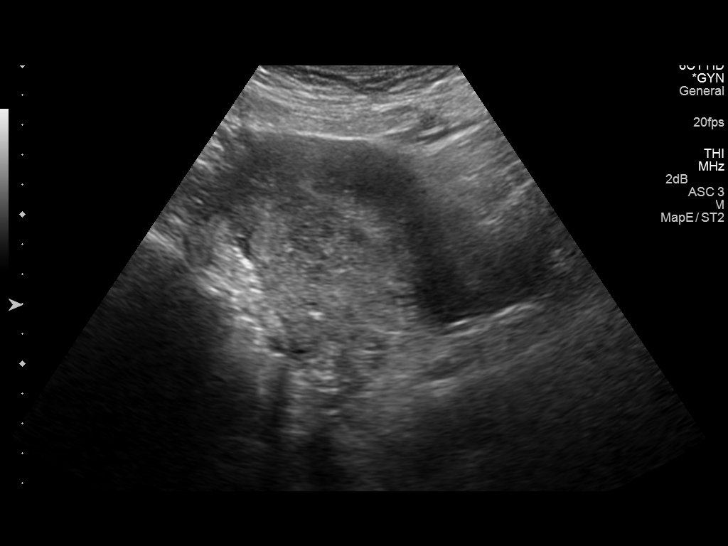
[im 16/92]
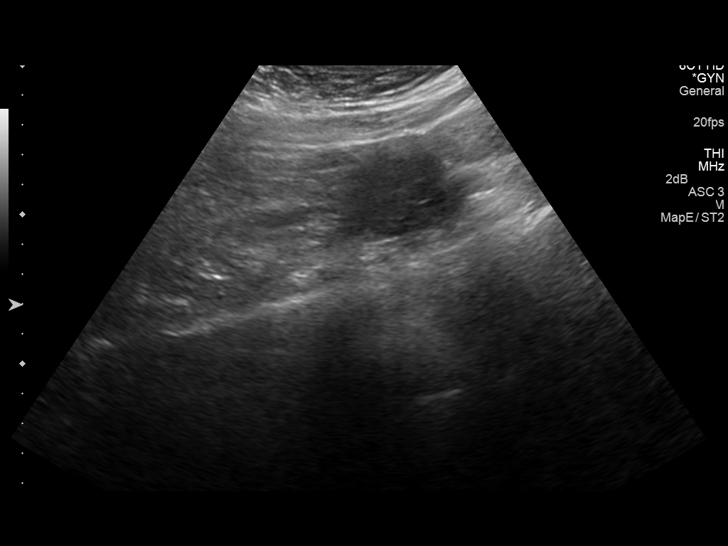
[im 23/92]
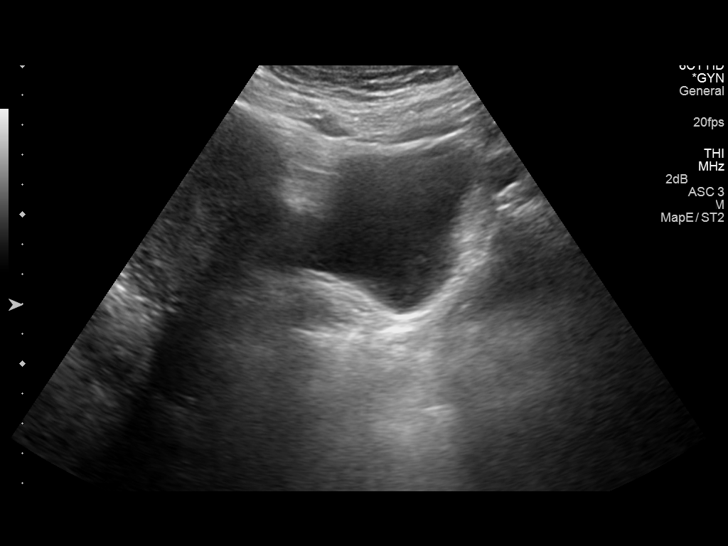
[im 31/92]
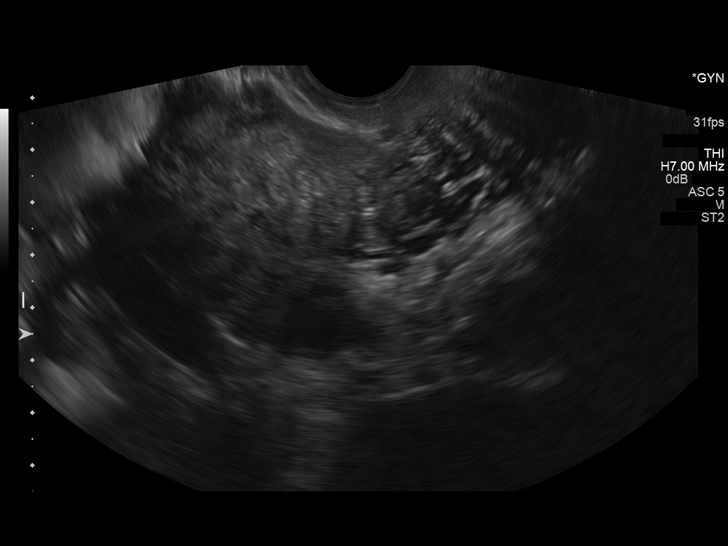
[im 38/92]
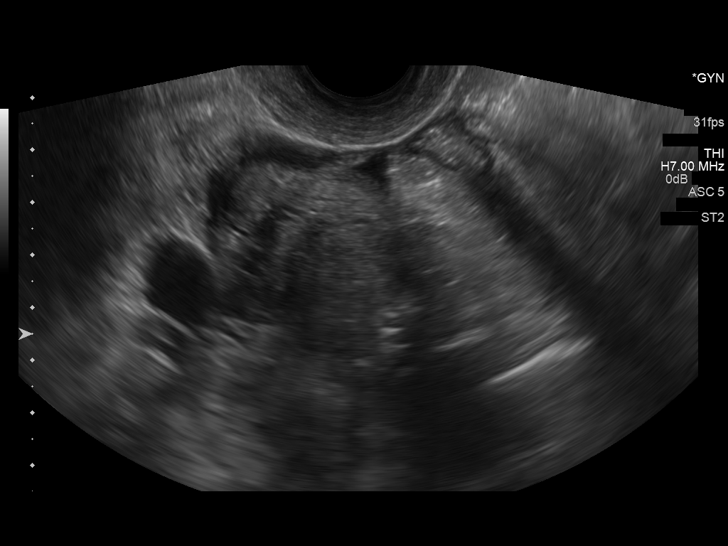
[im 46/92]
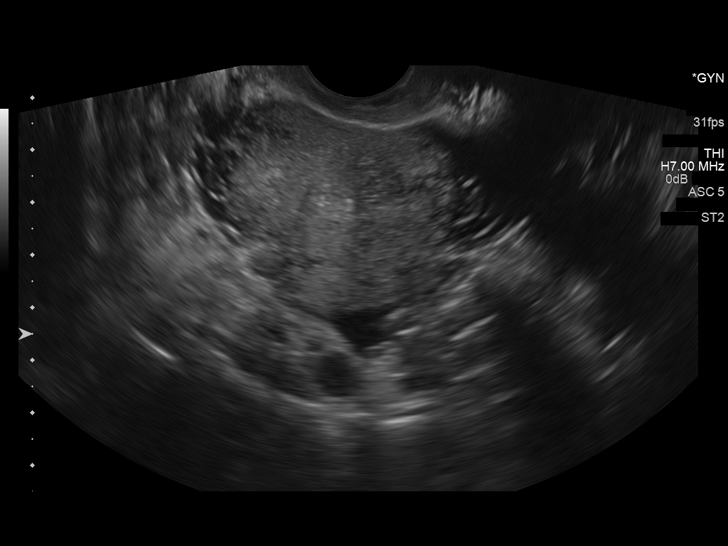
[im 54/92]
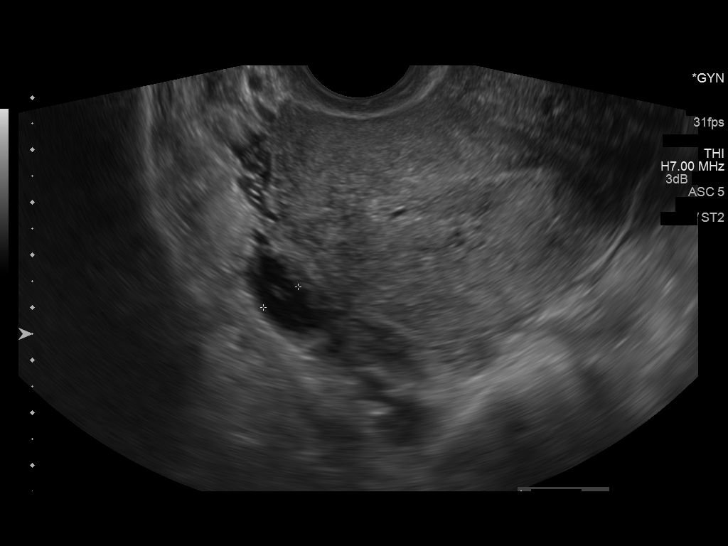
[im 61/92]
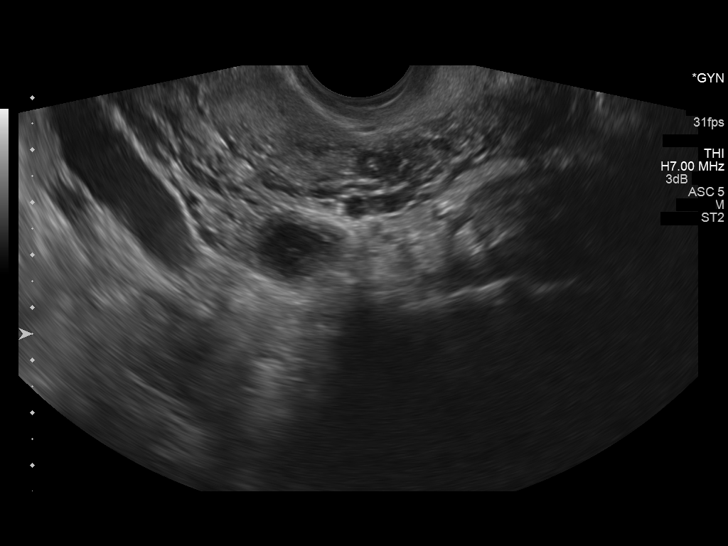
[im 69/92]
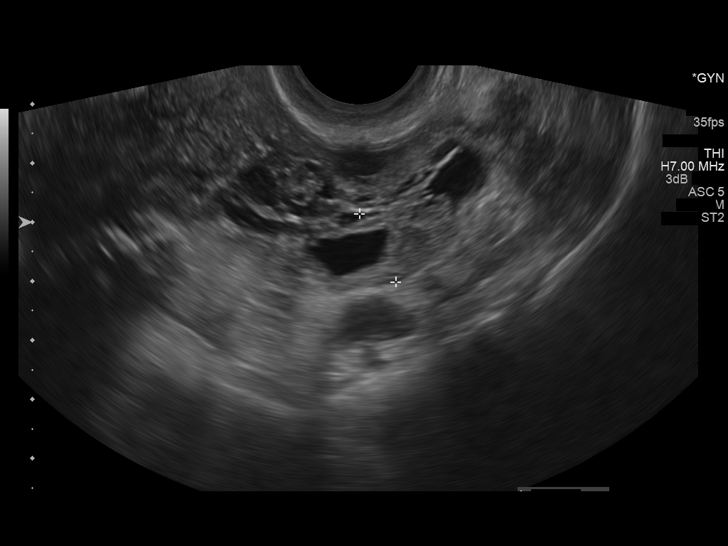
[im 76/92]
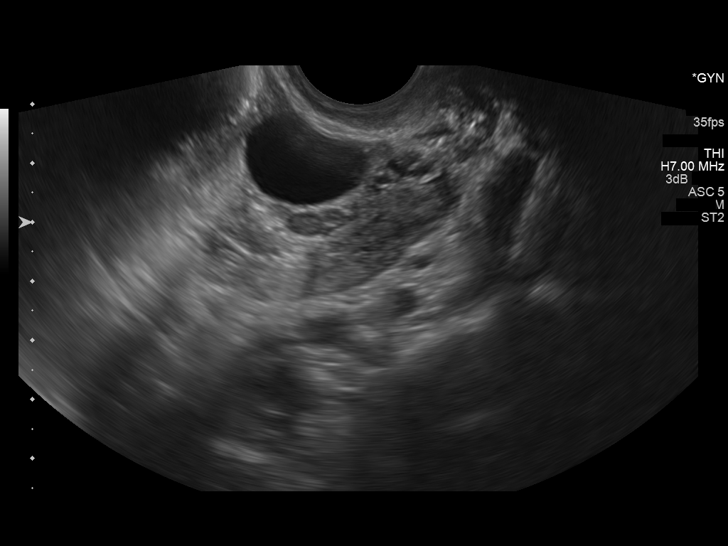
[im 84/92]
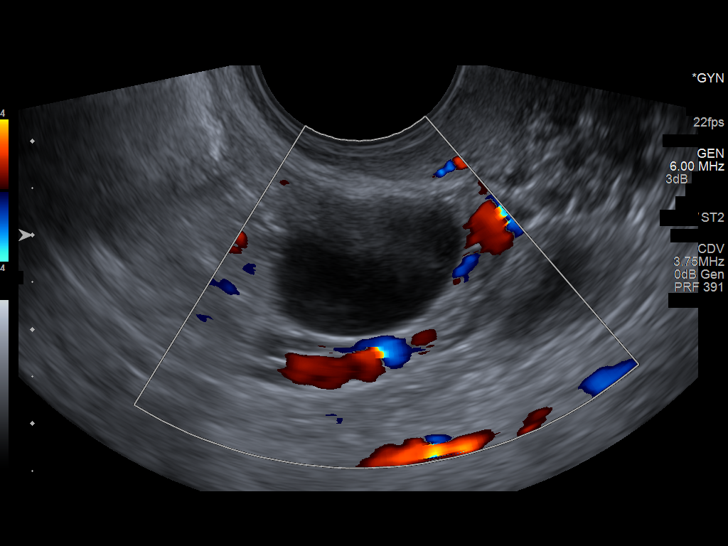
[im 92/92]
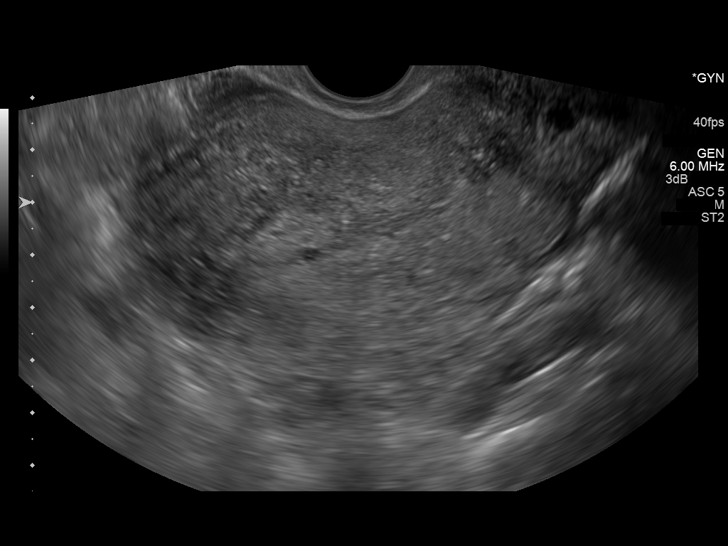

[13 of 25 positions shown; findings below may reference images not displayed]

FINDINGS: Uterus

Measurements: 10 x 5 x 7 cm. At least 1 7 endometrial cyst noted.

Endometrium

Thickness: 14 mm. At the fundus, there is minimal cystic change or
fluid accumulation. No discrete mass lesion is seen.

Right ovary

Measurements: 30 x 17 x 13 mm. A 15 mm hypoechoic mass within the
right ovary has no internal color flow, but does have mid level
echoes/septations.

Left ovary

Measurements: 32 x 10 x 13 mm. A rounded simple appearing cyst in
the left adnexa measures 22 mm and appears extra ovarian. There is a
flattened cyst which is intra-ovarian, 25 x 7 x 17 mm

Other findings

No concerning pelvic fluid
IMPRESSION: 1. 13 mm endometrial stripe with mild heterogeneity. In the setting
of abnormal bleeding and perimenopausal age, recommend gynecologic
referral for consideration of endometrial sampling.
2. 15 mm complex cyst in the right ovary, appearance suggests a
hemorrhagic cyst. Given age, followup in 6-12 weeks is recommended.
3. Simple appearing ovarian and paraovarian left-sided cysts
measuring 25 and 22 mm respectively.
4. Subendometrial cystic change compatible with adenomyosis.

## 2015-11-18 ENCOUNTER — Ambulatory Visit: Payer: Self-pay | Admitting: Family Medicine

## 2017-03-15 IMAGING — US US TRANSVAGINAL NON-OB
1 series · 15 of 25 positions shown · non-contrast
Comparison: November 05, 2014

CLINICAL DATA: Uncertain location of intrauterine device

EXAM:
ULTRASOUND PELVIS TRANSVAGINAL
TECHNIQUE: Transvaginal ultrasound examination of the pelvis was performed
including evaluation of the uterus, ovaries, adnexal regions, and
pelvic cul-de-sac.

[Series 1: us transvaginal non-ob · 15 of 44 slices shown]
[im 1/44]
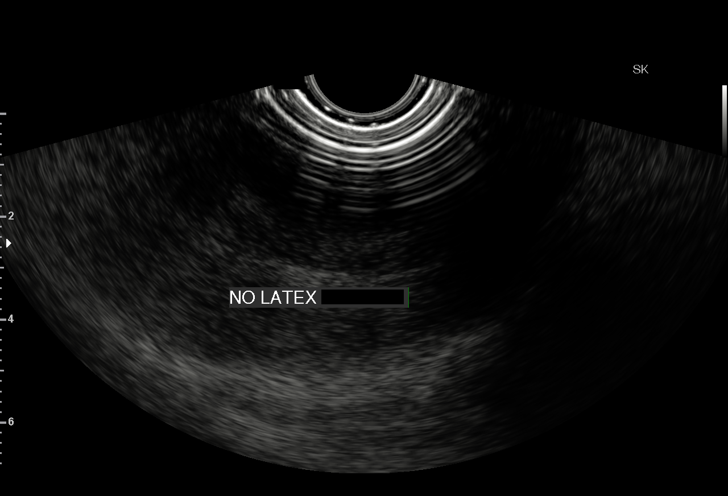
[im 4/44]
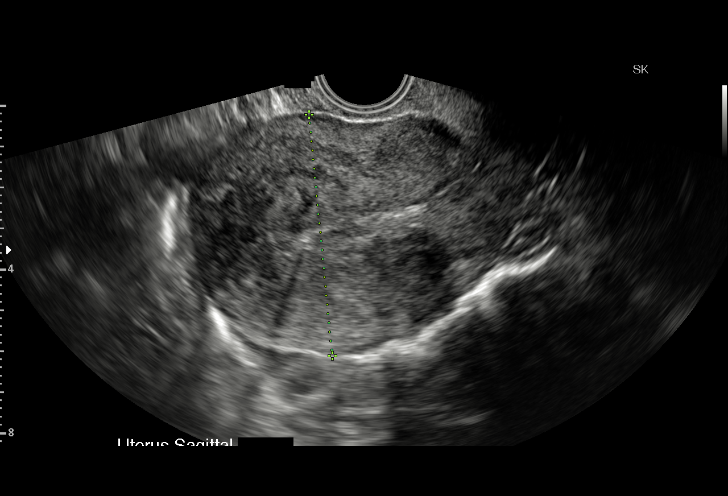
[im 8/44]
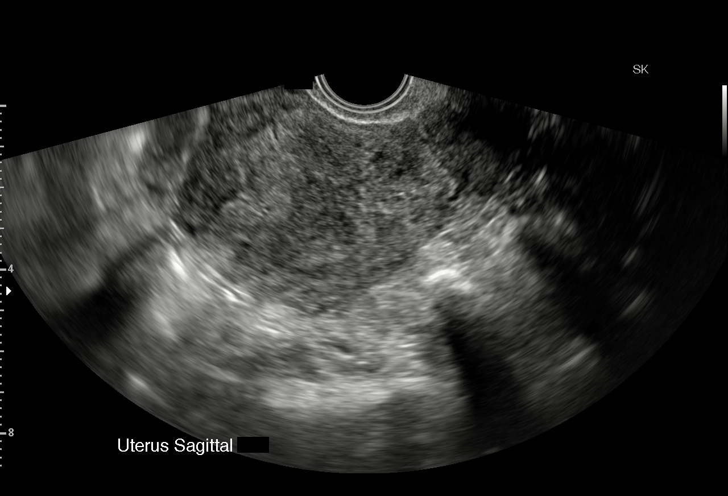
[im 9/44]
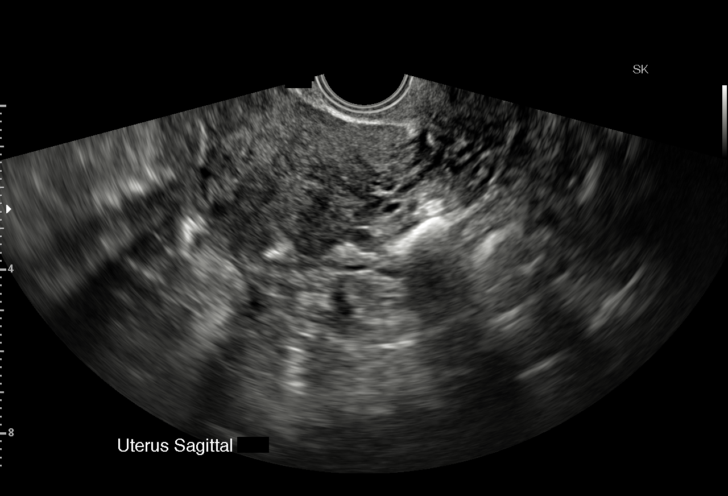
[im 13/44]
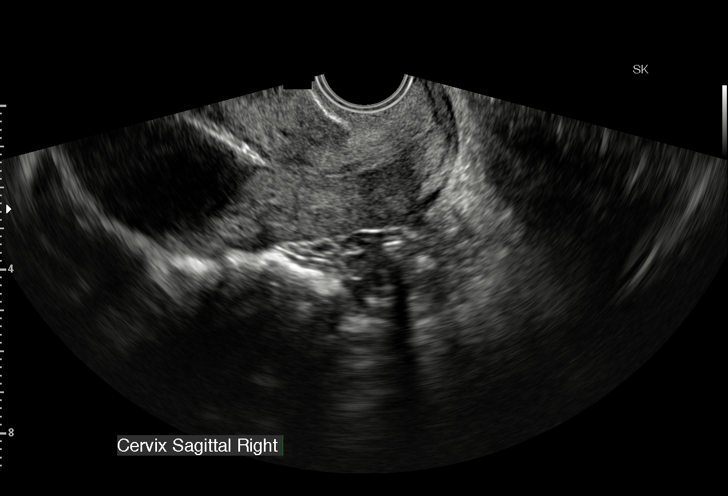
[im 17/44]
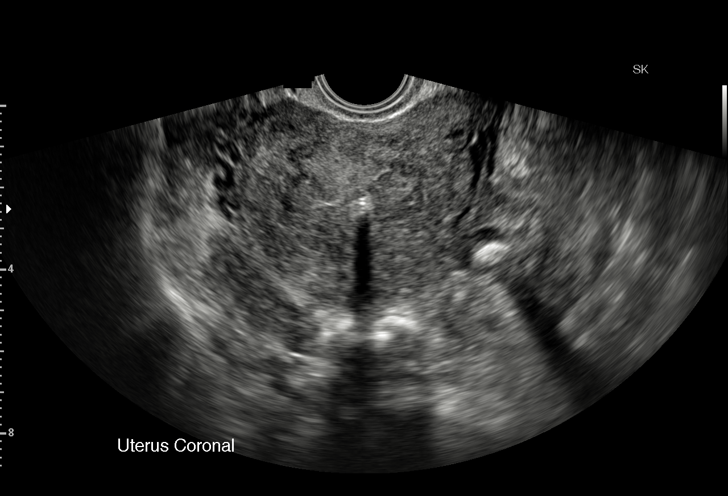
[im 18/44]
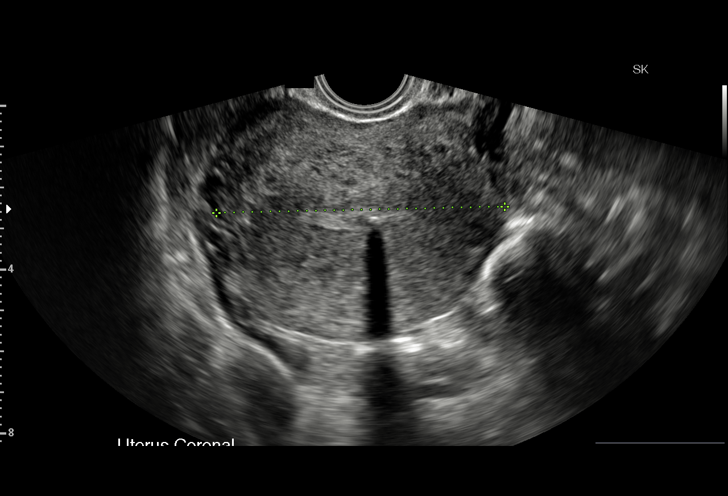
[im 22/44]
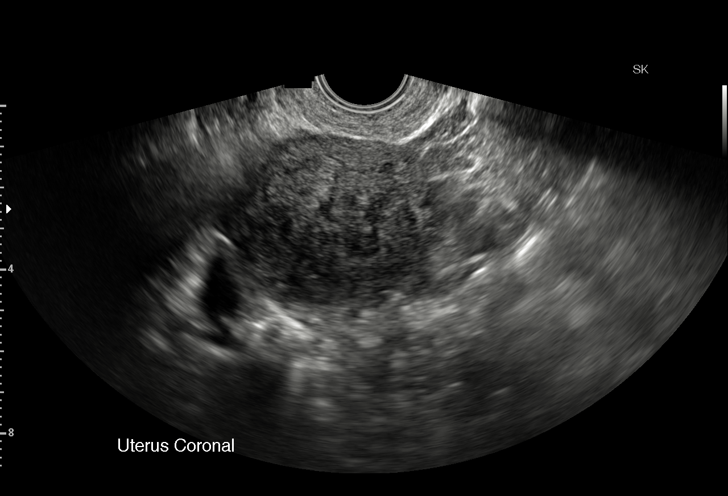
[im 26/44]
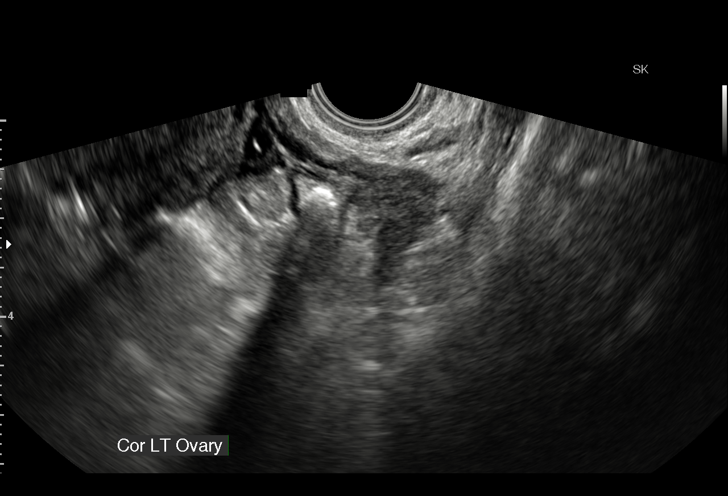
[im 27/44]
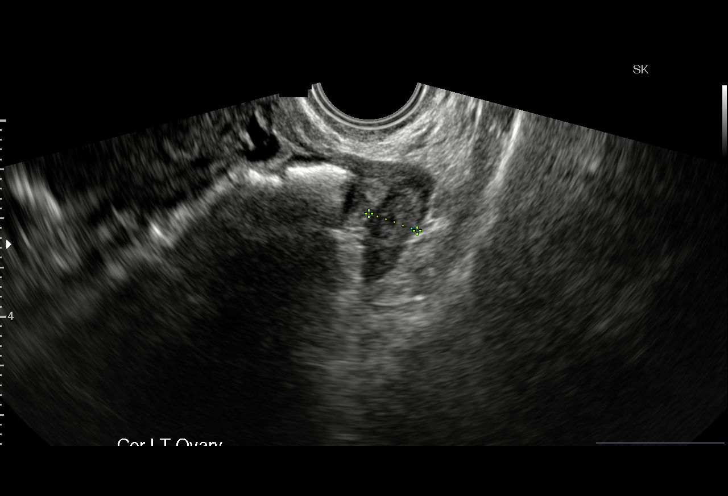
[im 31/44]
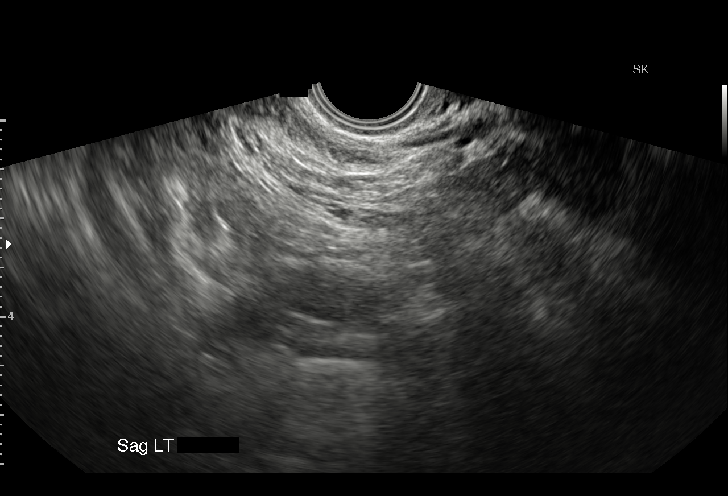
[im 35/44]
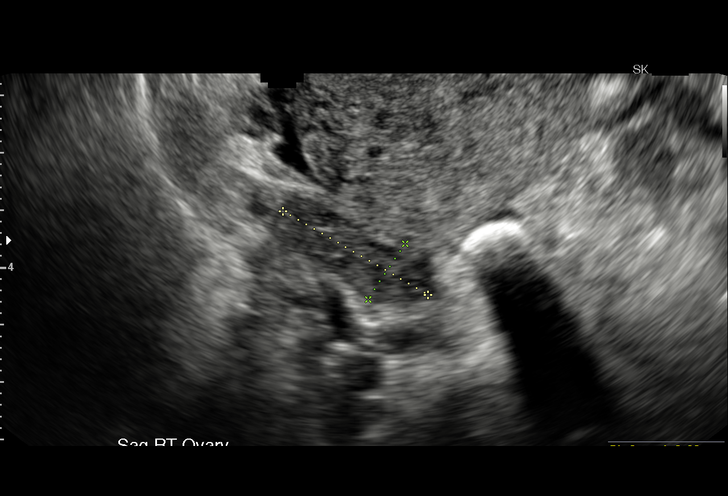
[im 36/44]
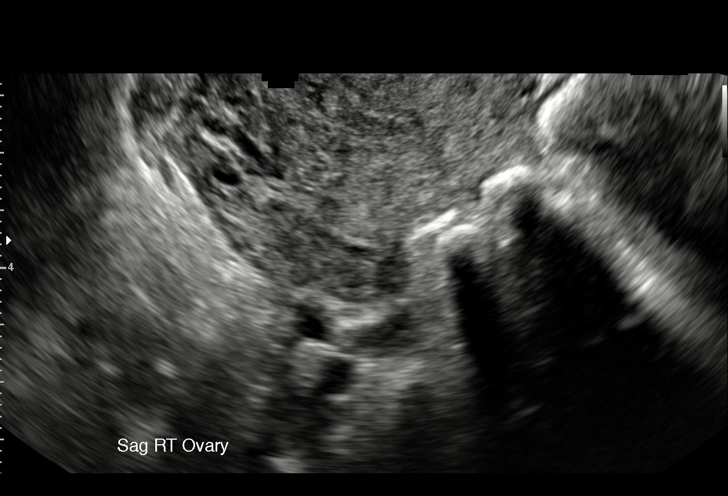
[im 40/44]
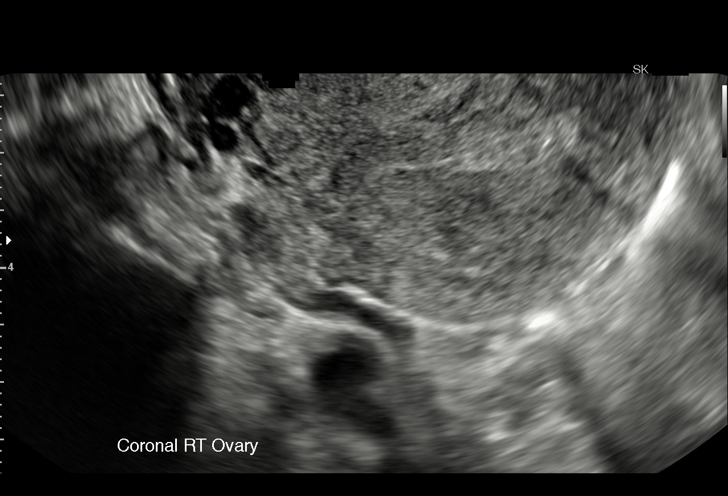
[im 44/44]
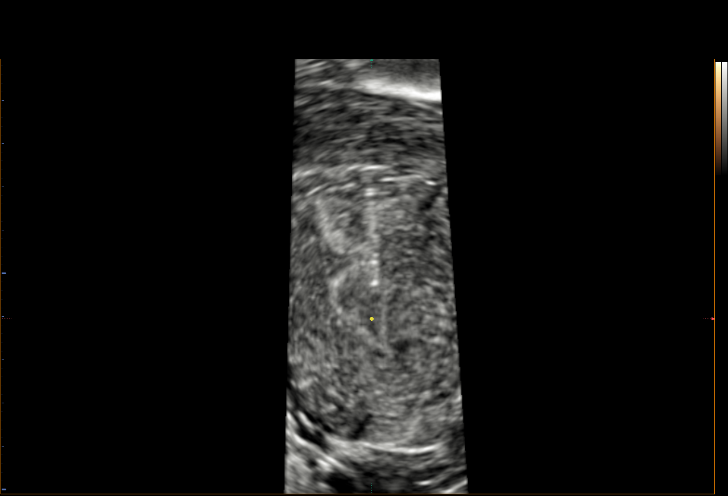

[15 of 25 positions shown; findings below may reference images not displayed]

FINDINGS: Uterus

Measurements: 5.7 x 5.9 x 7.0 cm. The echotexture of the uterus is
diffusely inhomogeneous. There is an area of decreased echogenicity
in the posterior fundal region measuring 3.4 x 2.7 x 2.1 cm, likely
a leiomyoma.

Endometrium

Thickness: 4 mm. No focal abnormality visualized. Intrauterine
device is seen within the endometrium.

Right ovary

Measurements: 2.9 x 1.2 x 0.8 cm. Normal appearance/no adnexal mass.

Left ovary

Measurements: 2.6 x 1.6 x 1.0 cm. Normal appearance/no adnexal mass.

Other findings:  No free fluid
IMPRESSION: The intrauterine device is positioned within the endometrium. Uterus
shows evidence of leiomyomatous change. No extrauterine pelvic or
adnexal mass. No free pelvic fluid.

## 2017-05-28 ENCOUNTER — Ambulatory Visit: Payer: Self-pay | Admitting: Family Medicine

## 2019-04-30 ENCOUNTER — Encounter: Payer: Self-pay | Admitting: Obstetrics and Gynecology

## 2019-05-01 ENCOUNTER — Other Ambulatory Visit: Payer: Self-pay | Admitting: Obstetrics and Gynecology

## 2019-05-01 DIAGNOSIS — T8332XA Displacement of intrauterine contraceptive device, initial encounter: Secondary | ICD-10-CM

## 2019-05-06 ENCOUNTER — Other Ambulatory Visit: Payer: Self-pay

## 2019-05-16 ENCOUNTER — Ambulatory Visit
Admission: RE | Admit: 2019-05-16 | Discharge: 2019-05-16 | Disposition: A | Discharge status: Home / Self Care | Payer: BC Managed Care – PPO | Source: Ambulatory Visit | Attending: Obstetrics and Gynecology | Admitting: Obstetrics and Gynecology

## 2019-05-16 DIAGNOSIS — T8332XA Displacement of intrauterine contraceptive device, initial encounter: Secondary | ICD-10-CM

## 2021-06-16 IMAGING — US US PELVIS COMPLETE WITH TRANSVAGINAL
1 series · 13 of 25 positions shown · non-contrast
Comparison: Prior ultrasound from 02/12/2015

CLINICAL DATA: Initial evaluation for loss of IUD threads.



[Series 1: us pelvis complete with transvaginal · 0.30mm/px · 13 of 49 slices shown]
[im 1/49]
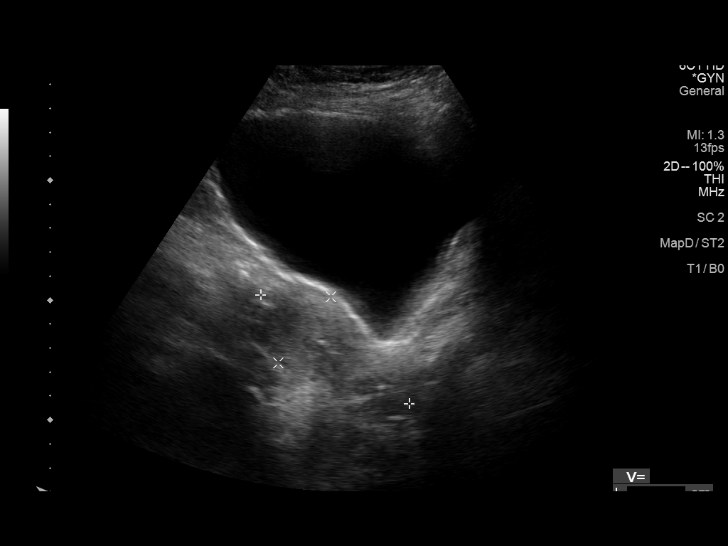
[im 5/49]
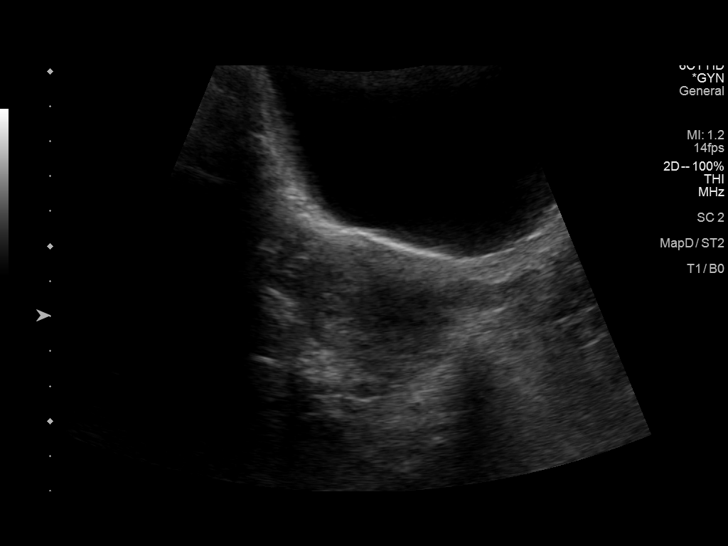
[im 9/49]
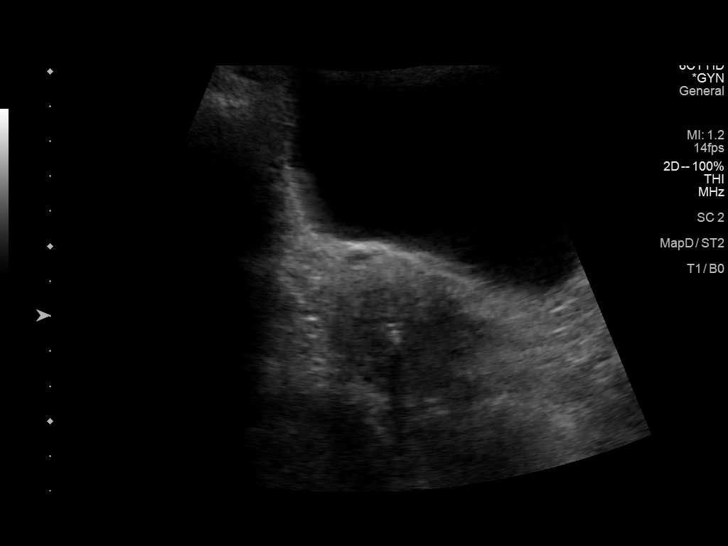
[im 13/49]
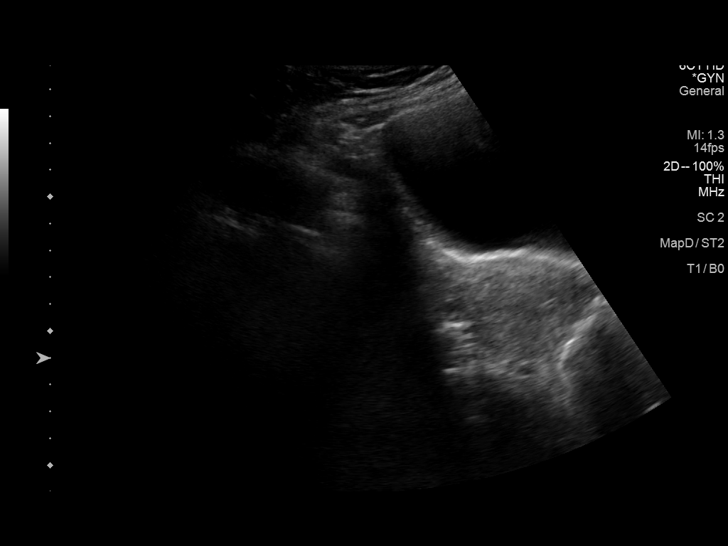
[im 17/49]
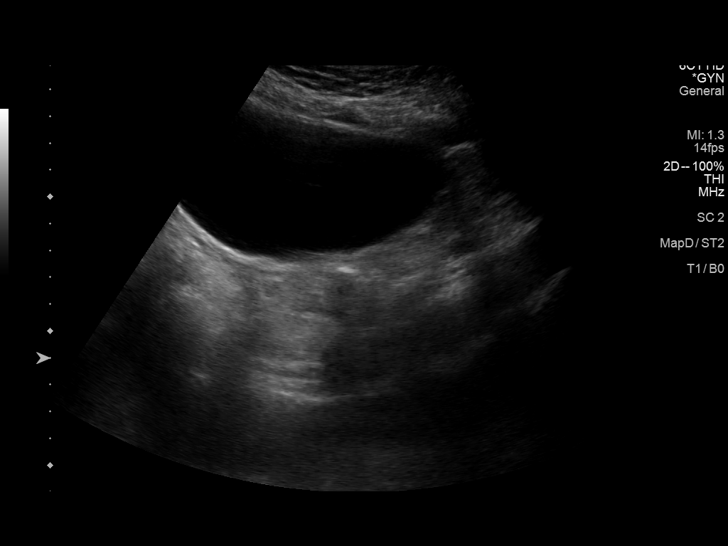
[im 21/49]
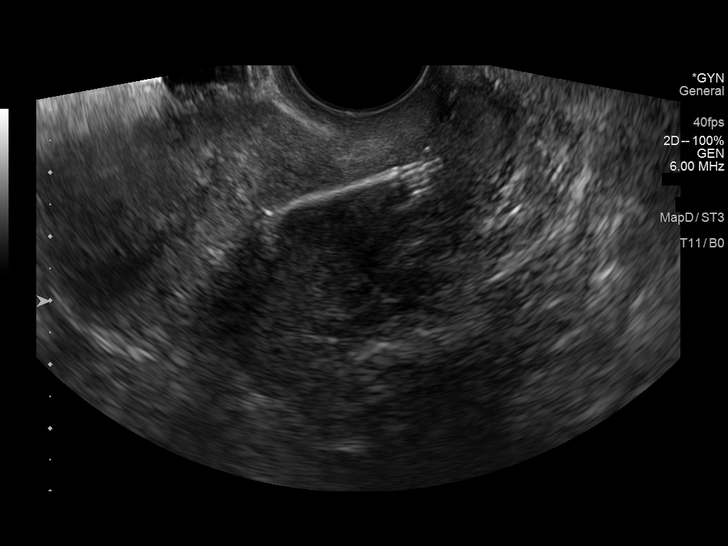
[im 25/49]
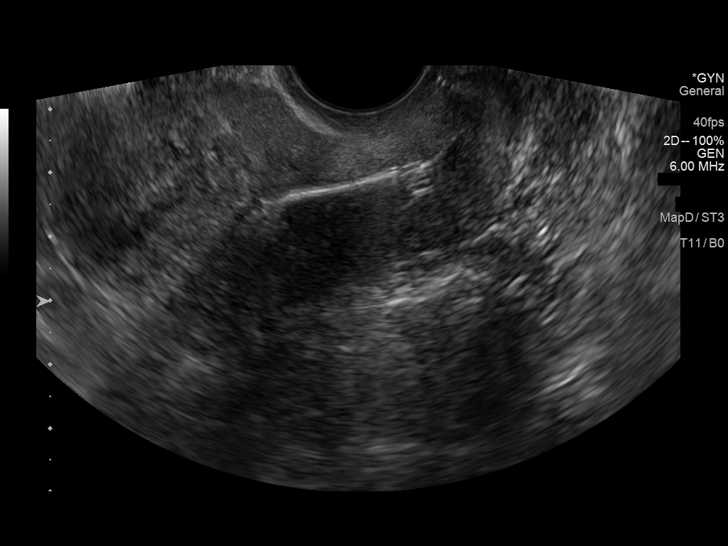
[im 29/49]
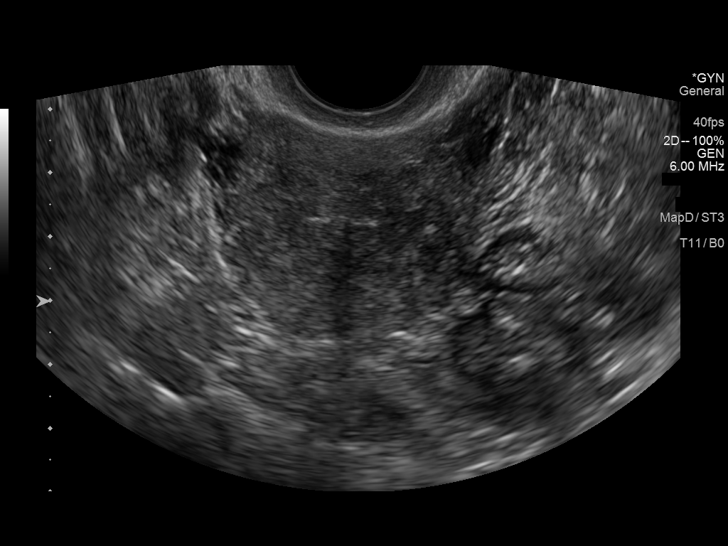
[im 33/49]
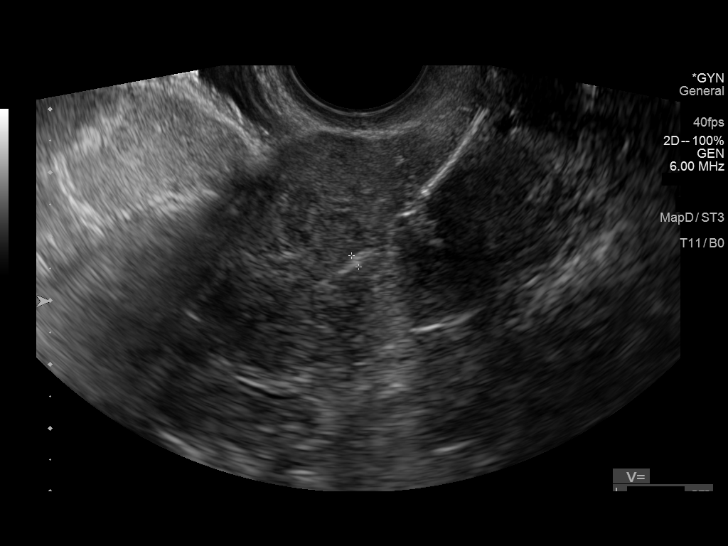
[im 37/49]
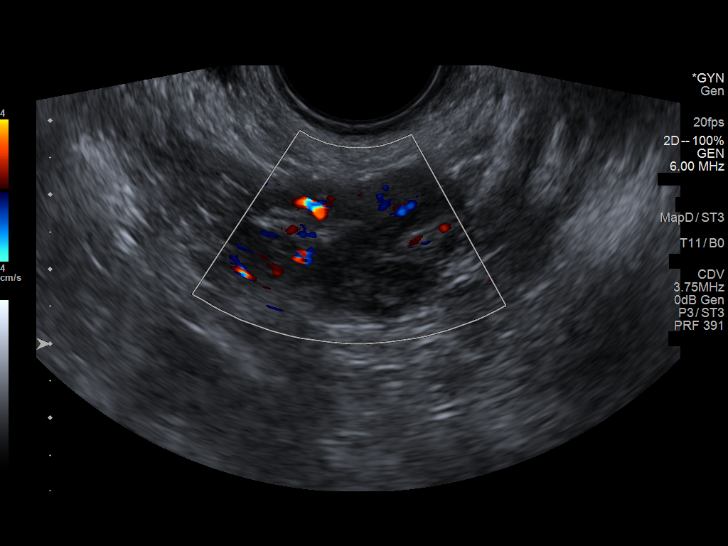
[im 41/49]
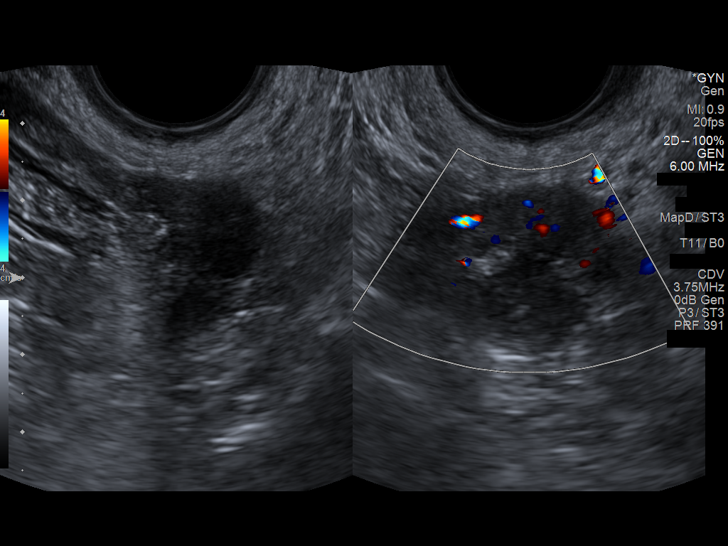
[im 45/49]
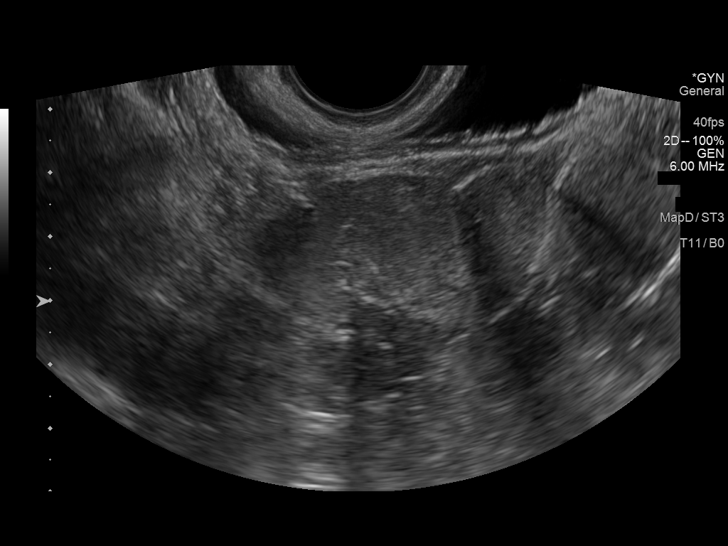
[im 49/49]
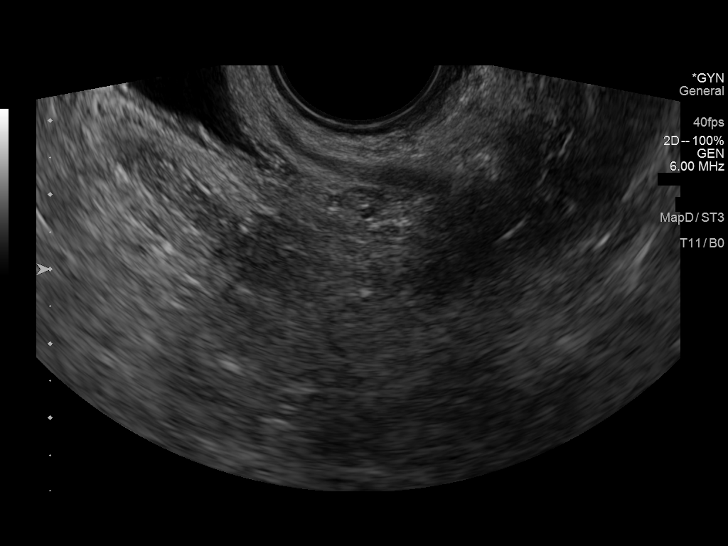

[13 of 25 positions shown; findings below may reference images not displayed]

FINDINGS: Uterus

Measurements: 7.7 x 3.5 x 4.6 cm = volume: 65 mL. Uterine myometrium
demonstrates a mildly heterogeneous echotexture without discrete
fibroid or other mass.

Endometrium

Thickness: 2.1 mm. No focal abnormality visualized. IUD seen within
the endometrial cavity at the level of the lower uterine segment.

Right ovary

Not visualized.  No adnexal mass.

Left ovary

Measurements: 1.8 x 2.5 x 2.6 cm = volume: 6.3 mL. Normal
appearance/no adnexal mass.

Other findings

No abnormal free fluid.
IMPRESSION: 1. IUD in place within the endometrial cavity at the level of the
lower uterine segment.
2. Otherwise normal sonographic appearance of the uterus and
myometrium.
3. Normal left ovary with nonvisualization of the right ovary. No
adnexal mass or free fluid within the pelvis.
